# Patient Record
Sex: Male | Born: 1940 | Race: White | Hispanic: No | Marital: Married | Smoking: Former smoker
Health system: Southern US, Community
[De-identification: ages and names within clinical notes are randomized; demographics above are authoritative.]

## PROBLEM LIST (undated history)

## (undated) DIAGNOSIS — C189 Malignant neoplasm of colon, unspecified: Secondary | ICD-10-CM

## (undated) DIAGNOSIS — I10 Essential (primary) hypertension: Secondary | ICD-10-CM

## (undated) DIAGNOSIS — E119 Type 2 diabetes mellitus without complications: Secondary | ICD-10-CM

## (undated) DIAGNOSIS — I2699 Other pulmonary embolism without acute cor pulmonale: Secondary | ICD-10-CM

## (undated) HISTORY — PX: PARATHYROIDECTOMY: SHX19

## (undated) HISTORY — PX: KNEE ARTHROSCOPY: SHX127

## (undated) HISTORY — PX: TOE AMPUTATION: SHX809

## (undated) HISTORY — DX: Essential (primary) hypertension: I10

## (undated) HISTORY — PX: FOOT SURGERY: SHX648

## (undated) HISTORY — PX: HEMORRHOID SURGERY: SHX153

## (undated) HISTORY — PX: TONSILLECTOMY: SUR1361

## (undated) HISTORY — DX: Other pulmonary embolism without acute cor pulmonale: I26.99

## (undated) HISTORY — DX: Type 2 diabetes mellitus without complications: E11.9

## (undated) HISTORY — PX: LITHOTRIPSY: SUR834

## (undated) HISTORY — PX: CHOLECYSTECTOMY: SHX55

## (undated) HISTORY — DX: Malignant neoplasm of colon, unspecified: C18.9

---

## 1999-09-08 ENCOUNTER — Ambulatory Visit (HOSPITAL_COMMUNITY): Admission: RE | Admit: 1999-09-08 | Discharge: 1999-09-08 | Payer: Self-pay | Admitting: Family Medicine

## 1999-09-08 ENCOUNTER — Encounter: Payer: Self-pay | Admitting: Family Medicine

## 2002-02-06 ENCOUNTER — Emergency Department (HOSPITAL_COMMUNITY): Admission: EM | Admit: 2002-02-06 | Discharge: 2002-02-06 | Payer: Self-pay

## 2004-02-15 ENCOUNTER — Other Ambulatory Visit: Payer: Self-pay

## 2004-02-19 ENCOUNTER — Ambulatory Visit: Payer: Self-pay | Admitting: Podiatry

## 2004-04-12 ENCOUNTER — Encounter: Payer: Self-pay | Admitting: Rheumatology

## 2004-05-09 ENCOUNTER — Encounter: Payer: Self-pay | Admitting: Rheumatology

## 2007-04-07 ENCOUNTER — Other Ambulatory Visit: Payer: Self-pay

## 2007-04-07 ENCOUNTER — Emergency Department: Payer: Self-pay | Admitting: Emergency Medicine

## 2007-04-15 ENCOUNTER — Ambulatory Visit: Payer: Self-pay | Admitting: Internal Medicine

## 2007-04-18 ENCOUNTER — Ambulatory Visit: Payer: Self-pay | Admitting: Internal Medicine

## 2007-04-23 ENCOUNTER — Ambulatory Visit: Payer: Self-pay | Admitting: Internal Medicine

## 2007-04-30 ENCOUNTER — Ambulatory Visit: Payer: Self-pay | Admitting: Internal Medicine

## 2011-07-27 DIAGNOSIS — I10 Essential (primary) hypertension: Secondary | ICD-10-CM | POA: Diagnosis present

## 2011-09-07 DIAGNOSIS — G909 Disorder of the autonomic nervous system, unspecified: Secondary | ICD-10-CM | POA: Diagnosis present

## 2013-12-15 ENCOUNTER — Ambulatory Visit: Payer: Self-pay | Admitting: Internal Medicine

## 2016-03-30 ENCOUNTER — Other Ambulatory Visit: Payer: Self-pay | Admitting: Orthopedic Surgery

## 2016-03-30 DIAGNOSIS — M25561 Pain in right knee: Secondary | ICD-10-CM

## 2016-04-05 ENCOUNTER — Encounter
Admission: RE | Admit: 2016-04-05 | Discharge: 2016-04-05 | Disposition: A | Discharge status: Home / Self Care | Payer: Medicare Other | Source: Ambulatory Visit | Attending: Orthopedic Surgery | Admitting: Orthopedic Surgery

## 2016-04-05 DIAGNOSIS — M25561 Pain in right knee: Secondary | ICD-10-CM | POA: Insufficient documentation

## 2016-04-05 MED ORDER — TECHNETIUM TC 99M MEDRONATE IV KIT
24.2000 | PACK | Freq: Once | INTRAVENOUS | Status: AC | PRN
  Administered 2016-04-05: 24.2 via INTRAVENOUS

## 2016-04-17 ENCOUNTER — Ambulatory Visit
Admission: RE | Admit: 2016-04-17 | Discharge: 2016-04-17 | Disposition: A | Discharge status: Home / Self Care | Payer: Medicare Other | Source: Ambulatory Visit | Attending: General Surgery | Admitting: General Surgery

## 2016-04-18 ENCOUNTER — Other Ambulatory Visit: Payer: Self-pay | Admitting: General Surgery

## 2016-04-18 DIAGNOSIS — E213 Hyperparathyroidism, unspecified: Secondary | ICD-10-CM

## 2016-04-18 DIAGNOSIS — R82994 Hypercalciuria: Secondary | ICD-10-CM

## 2016-04-18 DIAGNOSIS — N200 Calculus of kidney: Secondary | ICD-10-CM

## 2016-04-27 ENCOUNTER — Ambulatory Visit
Admission: RE | Admit: 2016-04-27 | Discharge: 2016-04-27 | Disposition: A | Discharge status: Home / Self Care | Payer: Medicare Other | Source: Ambulatory Visit | Attending: General Surgery | Admitting: General Surgery

## 2016-04-27 DIAGNOSIS — M85831 Other specified disorders of bone density and structure, right forearm: Secondary | ICD-10-CM | POA: Insufficient documentation

## 2016-04-27 DIAGNOSIS — E213 Hyperparathyroidism, unspecified: Secondary | ICD-10-CM

## 2016-04-27 DIAGNOSIS — N200 Calculus of kidney: Secondary | ICD-10-CM | POA: Diagnosis present

## 2016-04-27 DIAGNOSIS — M85852 Other specified disorders of bone density and structure, left thigh: Secondary | ICD-10-CM | POA: Insufficient documentation

## 2016-04-27 DIAGNOSIS — R82994 Hypercalciuria: Secondary | ICD-10-CM

## 2016-11-01 ENCOUNTER — Other Ambulatory Visit: Payer: Self-pay | Admitting: Internal Medicine

## 2016-11-01 DIAGNOSIS — M6281 Muscle weakness (generalized): Secondary | ICD-10-CM

## 2016-11-01 DIAGNOSIS — R531 Weakness: Secondary | ICD-10-CM

## 2016-11-07 ENCOUNTER — Ambulatory Visit
Admission: RE | Admit: 2016-11-07 | Discharge: 2016-11-07 | Disposition: A | Discharge status: Home / Self Care | Payer: Medicare Other | Source: Ambulatory Visit | Attending: Internal Medicine | Admitting: Internal Medicine

## 2016-11-07 DIAGNOSIS — R531 Weakness: Secondary | ICD-10-CM | POA: Insufficient documentation

## 2017-06-01 IMAGING — CT NM BONE 3 PHASE
1 series · 12 of 14 positions shown, 15 images · non-contrast
Comparison: None.

CLINICAL DATA: Right knee pain. History of total right knee
arthroplasty 6166.

EXAM:
NUCLEAR MEDICINE 3-PHASE BONE SCAN
TECHNIQUE: Radionuclide angiographic images, immediate static blood pool
images, and 3-hour delayed static images were obtained of the lower
extremities after intravenous injection of radiopharmaceutical.
RADIOPHARMACEUTICALS:  24.2 mCi Sc-22m MDP

[Series 4: 3d bone 1.25 b70s · axial · 0.98mm/px · z∈[+630,+961]mm · 12 of 561 slices shown, 15 images]
[im 44/561  soft-tissue]
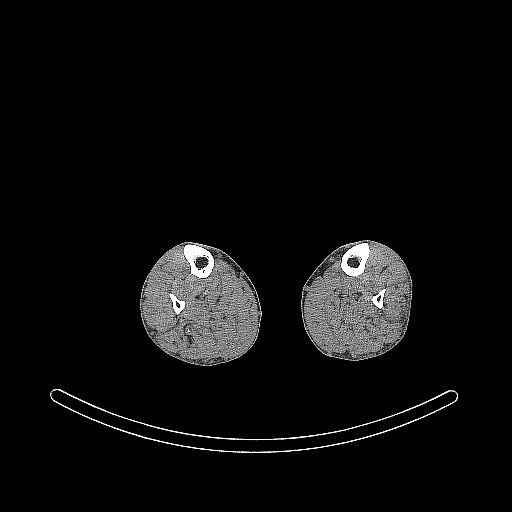
[im 44/561  bone]
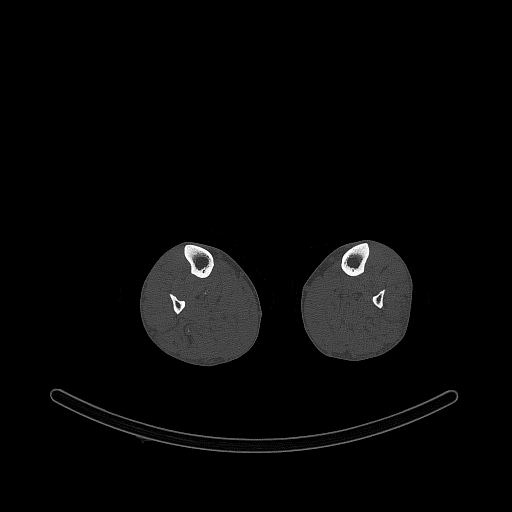
[im 87/561  bone]
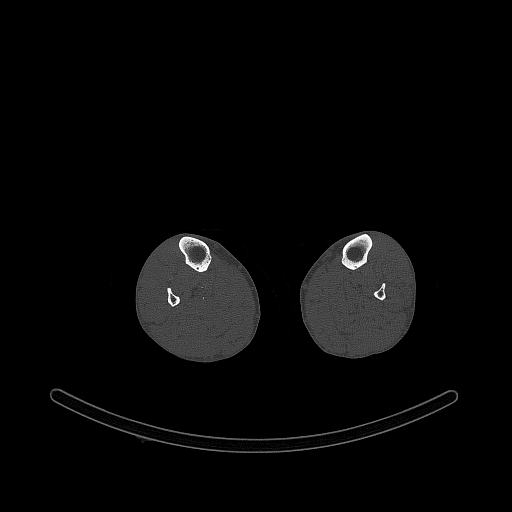
[im 130/561  bone]
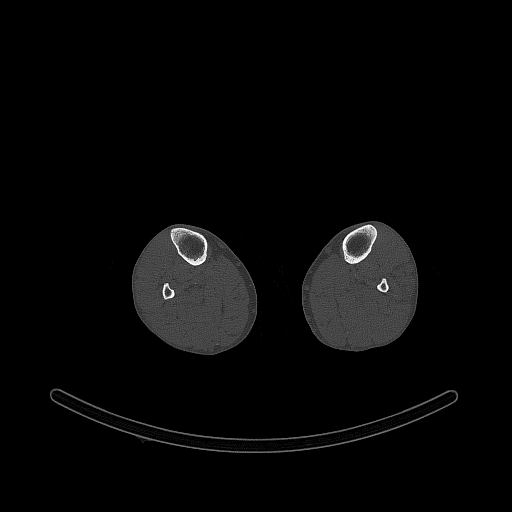
[im 173/561  bone]
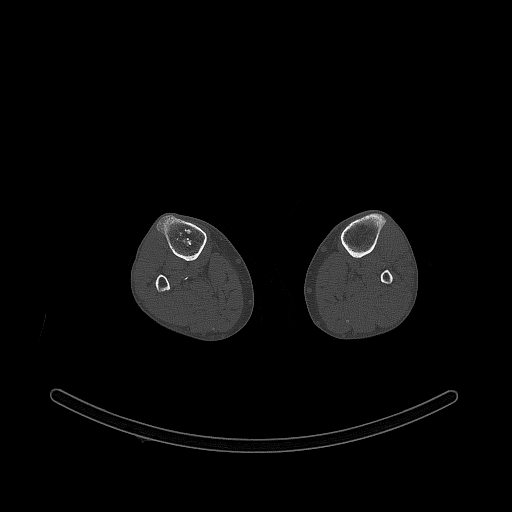
[im 216/561  soft-tissue]
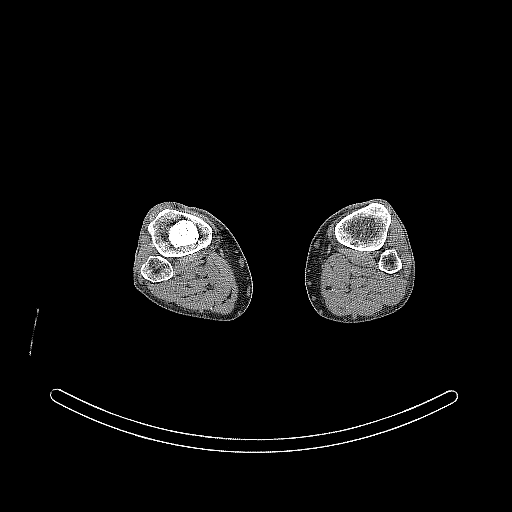
[im 216/561  bone]
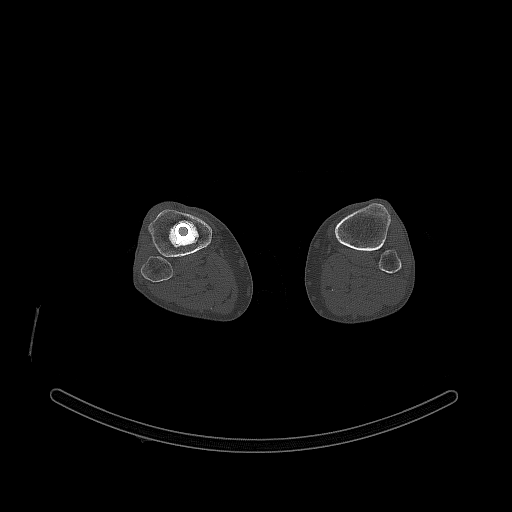
[im 259/561  bone]
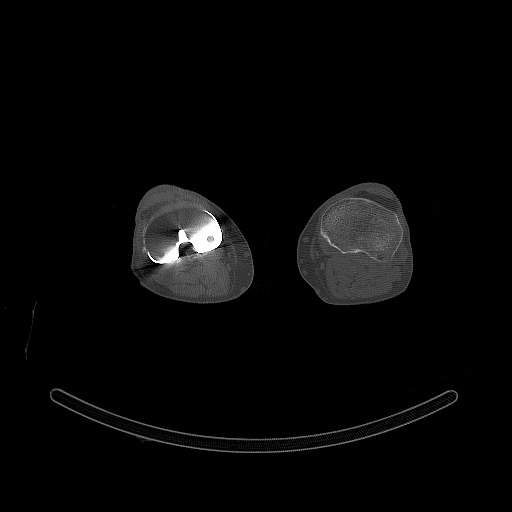
[im 302/561  bone]
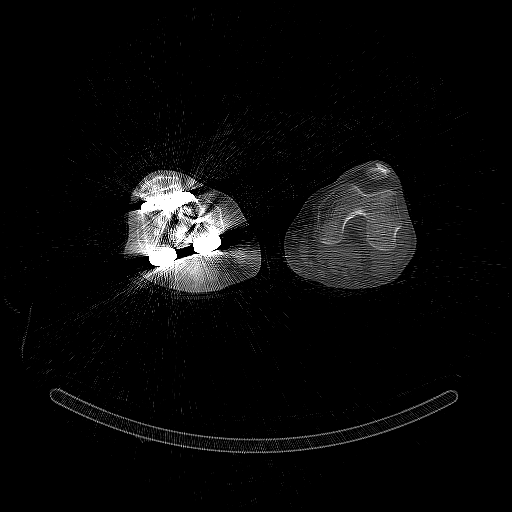
[im 345/561  bone]
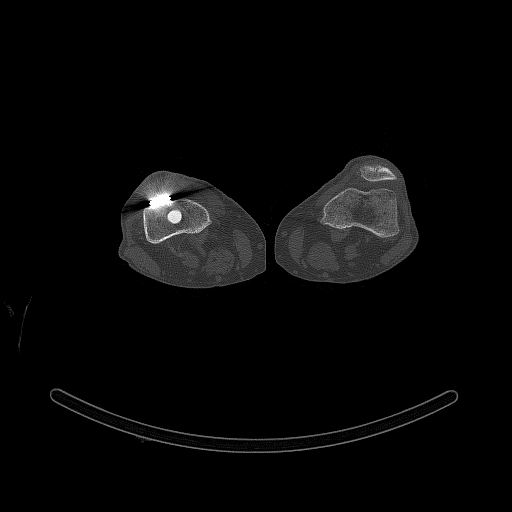
[im 388/561  soft-tissue]
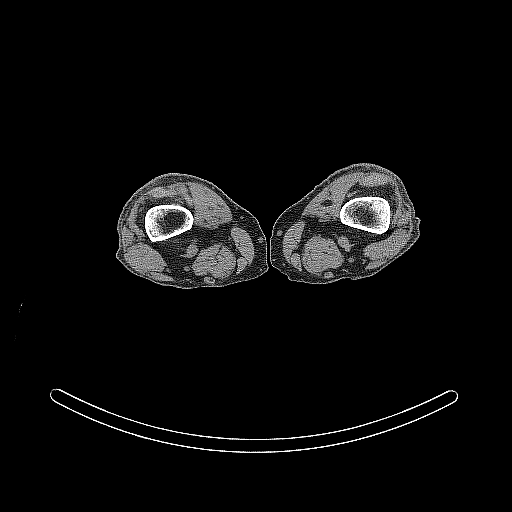
[im 388/561  bone]
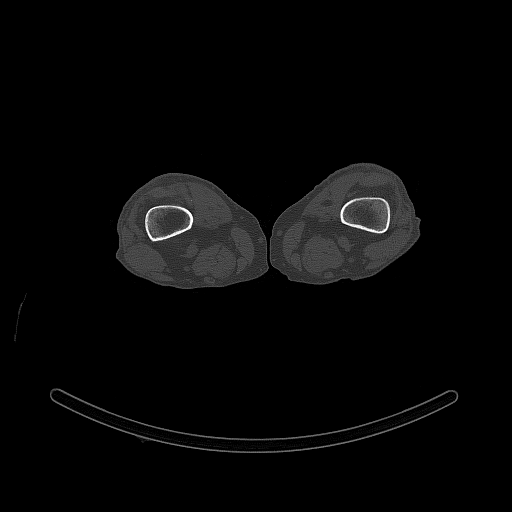
[im 431/561  bone]
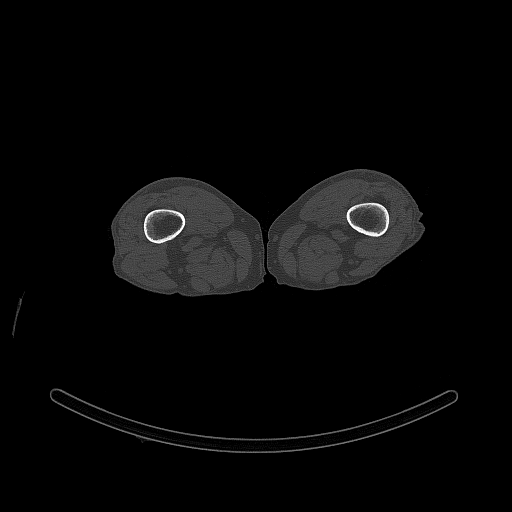
[im 474/561  bone]
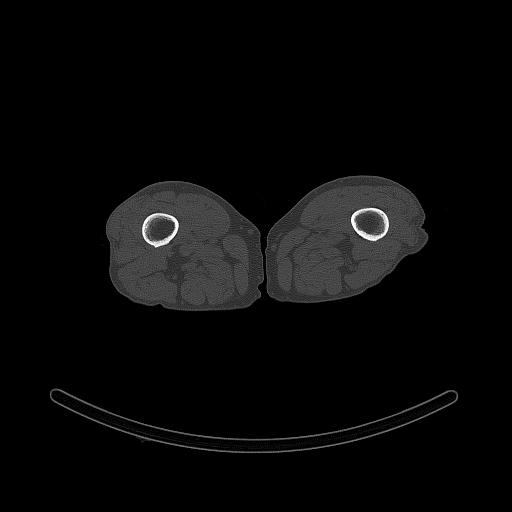
[im 517/561  bone]
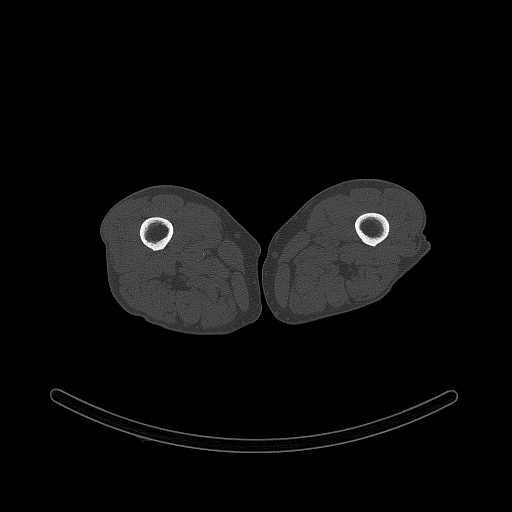

[12 of 14 positions shown; findings below may reference images not displayed]

FINDINGS: Vascular phase: Normal

Blood pool phase: Small focus of asymmetric uptake noted in the
region of the medial femoral condyle.

Delayed phase: Persistent focus of asymmetric uptake in the medial
femoral condyle. There is also fairly symmetric uptake in the tibia
which is probably chronic remodeling.
IMPRESSION: Small focus of asymmetric uptake noted in the medial femoral condyle
of the right knee without obvious CT correlate. This could be a
small stress reaction, stress fracture or focus of loosening.

## 2017-11-29 DIAGNOSIS — E1159 Type 2 diabetes mellitus with other circulatory complications: Secondary | ICD-10-CM | POA: Diagnosis present

## 2020-01-28 DIAGNOSIS — D682 Hereditary deficiency of other clotting factors: Secondary | ICD-10-CM | POA: Diagnosis present

## 2020-01-28 DIAGNOSIS — Z85038 Personal history of other malignant neoplasm of large intestine: Secondary | ICD-10-CM | POA: Insufficient documentation

## 2021-08-25 ENCOUNTER — Emergency Department: Payer: Medicare Other

## 2021-08-25 ENCOUNTER — Inpatient Hospital Stay
Admission: EM | Admit: 2021-08-25 | Discharge: 2021-08-28 | DRG: 871 | Disposition: A | Discharge status: Home / Self Care | Payer: Medicare Other | Attending: Internal Medicine | Admitting: Internal Medicine

## 2021-08-25 DIAGNOSIS — E1159 Type 2 diabetes mellitus with other circulatory complications: Secondary | ICD-10-CM | POA: Diagnosis present

## 2021-08-25 DIAGNOSIS — Z7901 Long term (current) use of anticoagulants: Secondary | ICD-10-CM | POA: Diagnosis not present

## 2021-08-25 DIAGNOSIS — E1142 Type 2 diabetes mellitus with diabetic polyneuropathy: Secondary | ICD-10-CM | POA: Diagnosis present

## 2021-08-25 DIAGNOSIS — E11610 Type 2 diabetes mellitus with diabetic neuropathic arthropathy: Secondary | ICD-10-CM | POA: Diagnosis present

## 2021-08-25 DIAGNOSIS — U071 COVID-19: Secondary | ICD-10-CM | POA: Diagnosis present

## 2021-08-25 DIAGNOSIS — R55 Syncope and collapse: Secondary | ICD-10-CM | POA: Diagnosis present

## 2021-08-25 DIAGNOSIS — F05 Delirium due to known physiological condition: Secondary | ICD-10-CM | POA: Diagnosis not present

## 2021-08-25 DIAGNOSIS — I110 Hypertensive heart disease with heart failure: Secondary | ICD-10-CM | POA: Diagnosis present

## 2021-08-25 DIAGNOSIS — I4892 Unspecified atrial flutter: Secondary | ICD-10-CM | POA: Diagnosis present

## 2021-08-25 DIAGNOSIS — Z794 Long term (current) use of insulin: Secondary | ICD-10-CM | POA: Diagnosis not present

## 2021-08-25 DIAGNOSIS — Z85038 Personal history of other malignant neoplasm of large intestine: Secondary | ICD-10-CM

## 2021-08-25 DIAGNOSIS — L97519 Non-pressure chronic ulcer of other part of right foot with unspecified severity: Secondary | ICD-10-CM | POA: Diagnosis present

## 2021-08-25 DIAGNOSIS — Z86718 Personal history of other venous thrombosis and embolism: Secondary | ICD-10-CM

## 2021-08-25 DIAGNOSIS — F0390 Unspecified dementia without behavioral disturbance: Secondary | ICD-10-CM | POA: Diagnosis present

## 2021-08-25 DIAGNOSIS — D6851 Activated protein C resistance: Secondary | ICD-10-CM | POA: Diagnosis present

## 2021-08-25 DIAGNOSIS — A419 Sepsis, unspecified organism: Secondary | ICD-10-CM | POA: Diagnosis present

## 2021-08-25 DIAGNOSIS — Z6828 Body mass index (BMI) 28.0-28.9, adult: Secondary | ICD-10-CM | POA: Diagnosis not present

## 2021-08-25 DIAGNOSIS — Z89411 Acquired absence of right great toe: Secondary | ICD-10-CM

## 2021-08-25 DIAGNOSIS — E11621 Type 2 diabetes mellitus with foot ulcer: Secondary | ICD-10-CM | POA: Diagnosis present

## 2021-08-25 DIAGNOSIS — D682 Hereditary deficiency of other clotting factors: Secondary | ICD-10-CM | POA: Diagnosis present

## 2021-08-25 DIAGNOSIS — K7200 Acute and subacute hepatic failure without coma: Secondary | ICD-10-CM | POA: Diagnosis present

## 2021-08-25 DIAGNOSIS — I10 Essential (primary) hypertension: Secondary | ICD-10-CM | POA: Diagnosis present

## 2021-08-25 DIAGNOSIS — R7401 Elevation of levels of liver transaminase levels: Secondary | ICD-10-CM | POA: Diagnosis present

## 2021-08-25 DIAGNOSIS — E872 Acidosis, unspecified: Secondary | ICD-10-CM | POA: Diagnosis present

## 2021-08-25 DIAGNOSIS — A4189 Other specified sepsis: Secondary | ICD-10-CM | POA: Diagnosis present

## 2021-08-25 DIAGNOSIS — G909 Disorder of the autonomic nervous system, unspecified: Secondary | ICD-10-CM | POA: Diagnosis present

## 2021-08-25 DIAGNOSIS — I5022 Chronic systolic (congestive) heart failure: Secondary | ICD-10-CM | POA: Diagnosis present

## 2021-08-25 DIAGNOSIS — I4891 Unspecified atrial fibrillation: Secondary | ICD-10-CM | POA: Diagnosis present

## 2021-08-25 DIAGNOSIS — E1169 Type 2 diabetes mellitus with other specified complication: Secondary | ICD-10-CM | POA: Diagnosis present

## 2021-08-25 DIAGNOSIS — J9601 Acute respiratory failure with hypoxia: Secondary | ICD-10-CM | POA: Diagnosis present

## 2021-08-25 DIAGNOSIS — R6520 Severe sepsis without septic shock: Secondary | ICD-10-CM | POA: Diagnosis present

## 2021-08-25 DIAGNOSIS — E663 Overweight: Secondary | ICD-10-CM | POA: Diagnosis present

## 2021-08-25 DIAGNOSIS — R531 Weakness: Secondary | ICD-10-CM

## 2021-08-25 DIAGNOSIS — F03918 Unspecified dementia, unspecified severity, with other behavioral disturbance: Secondary | ICD-10-CM | POA: Diagnosis not present

## 2021-08-25 DIAGNOSIS — Z86711 Personal history of pulmonary embolism: Secondary | ICD-10-CM

## 2021-08-25 LAB — URINALYSIS, COMPLETE (UACMP) WITH MICROSCOPIC
Bilirubin Urine: NEGATIVE
Glucose, UA: NEGATIVE mg/dL
Ketones, ur: 5 mg/dL — AB
Leukocytes,Ua: NEGATIVE
Nitrite: NEGATIVE
Protein, ur: 100 mg/dL — AB
RBC / HPF: >50 RBC/hpf — ABNORMAL HIGH (ref 0–5)
Specific Gravity, Urine: 1.014 (ref 1.005–1.030)
Squamous Epithelial / HPF: NONE SEEN (ref 0–5)
WBC, UA: NONE SEEN WBC/hpf (ref 0–5)
pH: 7 (ref 5.0–8.0)

## 2021-08-25 LAB — CBC WITH DIFFERENTIAL/PLATELET
Abs Immature Granulocytes: 0.03 10*3/uL (ref 0.00–0.07)
Basophils Absolute: 0 10*3/uL (ref 0.0–0.1)
Basophils Relative: 1 %
Eosinophils Absolute: 0 10*3/uL (ref 0.0–0.5)
Eosinophils Relative: 1 %
HCT: 38.5 % — ABNORMAL LOW (ref 39.0–52.0)
Hemoglobin: 12.7 g/dL — ABNORMAL LOW (ref 13.0–17.0)
Immature Granulocytes: 1 %
Lymphocytes Relative: 11 %
Lymphs Abs: 0.7 10*3/uL (ref 0.7–4.0)
MCH: 29.8 pg (ref 26.0–34.0)
MCHC: 33 g/dL (ref 30.0–36.0)
MCV: 90.4 fL (ref 80.0–100.0)
Monocytes Absolute: 1.2 10*3/uL — ABNORMAL HIGH (ref 0.1–1.0)
Monocytes Relative: 19 %
Neutro Abs: 4.4 10*3/uL (ref 1.7–7.7)
Neutrophils Relative %: 67 %
Platelets: 161 10*3/uL (ref 150–400)
RBC: 4.26 MIL/uL (ref 4.22–5.81)
RDW: 14.2 % (ref 11.5–15.5)
WBC: 6.4 10*3/uL (ref 4.0–10.5)
nRBC: 0 % (ref 0.0–0.2)

## 2021-08-25 LAB — COMPREHENSIVE METABOLIC PANEL
ALT: 66 U/L — ABNORMAL HIGH (ref 0–44)
AST: 84 U/L — ABNORMAL HIGH (ref 15–41)
Albumin: 3.9 g/dL (ref 3.5–5.0)
Alkaline Phosphatase: 71 U/L (ref 38–126)
Anion gap: 12 (ref 5–15)
BUN: 15 mg/dL (ref 8–23)
CO2: 23 mmol/L (ref 22–32)
Calcium: 8.7 mg/dL — ABNORMAL LOW (ref 8.9–10.3)
Chloride: 98 mmol/L (ref 98–111)
Creatinine, Ser: 0.75 mg/dL (ref 0.61–1.24)
GFR, Estimated: >60 mL/min (ref >60)
Glucose, Bld: 181 mg/dL — ABNORMAL HIGH (ref 70–99)
Potassium: 3.6 mmol/L (ref 3.5–5.1)
Sodium: 133 mmol/L — ABNORMAL LOW (ref 135–145)
Total Bilirubin: 1.1 mg/dL (ref 0.3–1.2)
Total Protein: 7.2 g/dL (ref 6.5–8.1)

## 2021-08-25 LAB — RESP PANEL BY RT-PCR (FLU A&B, COVID) ARPGX2
Influenza A by PCR: NEGATIVE
Influenza B by PCR: NEGATIVE
SARS Coronavirus 2 by RT PCR: POSITIVE — AB

## 2021-08-25 LAB — BRAIN NATRIURETIC PEPTIDE: B Natriuretic Peptide: 126.5 pg/mL — ABNORMAL HIGH (ref 0.0–100.0)

## 2021-08-25 LAB — TROPONIN I (HIGH SENSITIVITY): Troponin I (High Sensitivity): 13 ng/L (ref <18)

## 2021-08-25 LAB — LACTIC ACID, PLASMA: Lactic Acid, Venous: 1.8 mmol/L (ref 0.5–1.9)

## 2021-08-25 MED ORDER — LACTATED RINGERS IV BOLUS (SEPSIS)
1000.0000 mL | Freq: Once | INTRAVENOUS | Status: AC
  Administered 2021-08-25: 1000 mL via INTRAVENOUS

## 2021-08-25 MED ORDER — LACTATED RINGERS IV BOLUS (SEPSIS)
1000.0000 mL | Freq: Once | INTRAVENOUS | Status: DC
  Administered 2021-08-25: 1000 mL via INTRAVENOUS

## 2021-08-25 MED ORDER — IOHEXOL 350 MG/ML SOLN
75.0000 mL | Freq: Once | INTRAVENOUS | Status: AC | PRN
  Administered 2021-08-25: 75 mL via INTRAVENOUS

## 2021-08-25 MED ORDER — LACTATED RINGERS IV BOLUS (SEPSIS): 1000.0000 mL | Freq: Once | INTRAVENOUS | Status: DC

## 2021-08-25 NOTE — H&P (Signed)
History and Physical    Patient: Daniel Owen EHM:[id number] DOB: 1940/08/09 DOA: 08/25/2021 DOS: the patient was seen and examined on 08/25/2021 PCP: Jaclyn Shaggy, MD  Patient coming from: Home  Chief Complaint:  Chief Complaint  Patient presents with   Loss of Consciousness    Patient had syncopal episode at home on the toilet per family. Family states this has happened before and patient was dehydrated.     HPI: Daniel Owen is a 81 y.o. 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  Patient reported a 3-day history of congestion and sore throat associated with generalized weakness and decreased energy .  He had mild shortness of breath but no cough, fever or chills or no chest pain he has no nausea, vomiting or abdominal pain or diarrhea did have decreased oral intake.  EMS recorded O2 sats in the mid to upper 80s on room air on their arrival and he required O2 for transport ED course and data review: Temp 99.5, pulse 111 and respirations 23 with BP 97/68 and O2 sat 94% on 2 L.  Labs significant for normal WBC with lactic acid 1.8.  Hemoglobin 12.7.  Troponin 13 and BNP 126.  Mildly elevated transaminases to AST 686 and ALT 66.  Sodium 133.  COVID-positive.  Urinalysis not consistent with UTI. EKG, personally viewed and interpreted showing a flutter with a rate of 110. CTA chest negative for PE or acute findings in the chest  Patient was given a 1 L LR  bolus with improvement in BP to 108/67 by admission.  Hospitalist consulted for admission.    Prior to Admission medications   Not on File    Physical Exam: Vitals:   08/25/21 2037 08/25/21 2115 08/25/21 2130 08/25/21 2200  BP:  (!) 90/52 111/71 108/67  Pulse:  (!) 110 (!) 117 (!) 116  Resp:  (!) 23 (!) 27 (!) 21  Temp:    98.4 F (36.9 C)  TempSrc:    Oral  SpO2:  95% 100% 98%  Weight: 95.3 kg      Physical Exam Vitals and nursing note reviewed.  Constitutional:      General: He is not in acute distress.    Interventions: Nasal cannula in place.     Comments: Conversational dyspnea  HENT:     Head: Normocephalic and atraumatic.  Cardiovascular:     Rate and Rhythm: Tachycardia present. Rhythm irregular.     Heart sounds: Normal heart sounds.  Pulmonary:     Effort: Tachypnea present.     Breath sounds: Normal breath sounds.  Abdominal:     Palpations: Abdomen is soft.     Tenderness: There is no abdominal tenderness.  Neurological:     Mental Status: Mental status is at baseline.     Labs on Admission: I have personally reviewed following labs and imaging studies  CBC: Recent Labs  Lab 08/25/21 2040  WBC 6.4  NEUTROABS 4.4  HGB 12.7*  HCT 38.5*  MCV 90.4  PLT 161   Basic Metabolic Panel: Recent Labs  Lab 08/25/21 2040  NA 133*  K 3.6  CL 98  CO2 23  GLUCOSE 181*  BUN 15  CREATININE 0.75  CALCIUM 8.7*   GFR: CrCl cannot be calculated (Unknown ideal weight.). Liver Function Tests: Recent Labs  Lab 08/25/21 2040  AST 84*  ALT 66*  ALKPHOS 71  BILITOT 1.1  PROT 7.2  ALBUMIN 3.9   No results for input(s): "LIPASE", "AMYLASE" in the last 168 hours. No results for input(s): "AMMONIA" in the last 168 hours. Coagulation Profile: No results for input(s): "INR", "PROTIME" in the last 168 hours. Cardiac Enzymes: No results for input(s): "CKTOTAL", "CKMB", "CKMBINDEX", "TROPONINI" in the last 168 hours. BNP (last 3 results) No results for  input(s): "PROBNP" in the last 8760 hours. HbA1C: No results for input(s): "HGBA1C" in the last 72 hours. CBG: No results for input(s): "GLUCAP" in the last 168 hours. Lipid Profile: No results for input(s): "CHOL", "HDL", "LDLCALC", "TRIG", "CHOLHDL", "LDLDIRECT" in the last 72 hours. Thyroid Function Tests: No results for input(s): "TSH", "T4TOTAL", "FREET4", "T3FREE", "THYROIDAB" in the last 72 hours. Anemia Panel: No results for input(s): "VITAMINB12", "FOLATE", "FERRITIN", "TIBC", "IRON", "RETICCTPCT" in the last 72 hours. Urine analysis:    Component Value Date/Time   COLORURINE YELLOW (A) 08/25/2021 2234   APPEARANCEUR HAZY (A) 08/25/2021 2234   LABSPEC 1.014 08/25/2021 2234   PHURINE 7.0 08/25/2021 2234   GLUCOSEU NEGATIVE 08/25/2021 2234   HGBUR LARGE (A) 08/25/2021 2234   BILIRUBINUR NEGATIVE 08/25/2021 2234   KETONESUR 5 (A) 08/25/2021 2234   PROTEINUR 100 (A) 08/25/2021 2234   NITRITE NEGATIVE 08/25/2021 2234   LEUKOCYTESUR NEGATIVE 08/25/2021 2234    Radiological Exams on Admission: CT Angio Chest PE W and/or Wo Contrast  Result Date: 08/25/2021 CLINICAL DATA:  Syncopal episode PE suspected EXAM: CT ANGIOGRAPHY CHEST WITH CONTRAST TECHNIQUE: Multidetector CT imaging of the chest was performed using the standard protocol during bolus administration of intravenous contrast. Multiplanar CT image reconstructions and MIPs were obtained to evaluate the vascular anatomy. RADIATION DOSE REDUCTION: This exam was performed according to the departmental dose-optimization program which includes automated exposure control, adjustment of the mA and/or kV according to patient size and/or use of iterative reconstruction technique. CONTRAST:  54mL OMNIPAQUE IOHEXOL 350 MG/ML SOLN COMPARISON:  Radiographs earlier today and CT chest 04/07/2007 FINDINGS: Cardiovascular: Satisfactory opacification of the pulmonary arteries to the segmental level. No evidence of pulmonary embolism. Normal heart  size. No pericardial effusion. Advanced coronary artery atherosclerotic calcification. Aortic calcification. Mediastinum/Nodes: No enlarged mediastinal, hilar, or axillary lymph nodes. Thyroid gland, trachea, and esophagus demonstrate no significant findings. Lungs/Pleura: Respiratory motion obscures fine detail. Bibasilar atelectasis/scarring. No focal consolidation, pleural effusion or pneumothorax. Upper Abdomen: No acute abnormality. Musculoskeletal: No chest wall abnormality. No acute or significant osseous findings. Review of the MIP images confirms the above findings. IMPRESSION: Negative for acute pulmonary embolism. No acute findings in the chest. Advanced coronary artery atherosclerosis. Aortic Atherosclerosis (ICD10-I70.0). Electronically Signed   By: Minerva Fester M.D.   On: 08/25/2021 22:45   DG Chest Port 1 View  Result Date: 08/25/2021 CLINICAL DATA:  Questionable sepsis - evaluate for abnormality EXAM: PORTABLE CHEST 1 VIEW COMPARISON:  Chest x-ray 12/15/2013 FINDINGS: The heart and mediastinal contours are unchanged. Persistent elevation of the left hemidiaphragm. No focal consolidation. No pulmonary edema. No pleural effusion. No pneumothorax. No acute osseous abnormality.  Old healed left rib fractures. IMPRESSION: No active disease. Electronically Signed  By: Tish Frederickson M.D.   On: 08/25/2021 21:26     Data Reviewed: Relevant notes from primary care and specialist visits, past discharge summaries as available in EHR, including Care Everywhere. Prior diagnostic testing as pertinent to current admission diagnoses Updated medications and problem lists for reconciliation ED course, including vitals, labs, imaging, treatment and response to treatment Triage notes, nursing and pharmacy notes and ED provider's notes Notable results as noted in HPI   Assessment and Plan: * Acute hypoxemic respiratory failure due to COVID-19 (HCC) O2 sat mid to upper 80s with EMS requiring 2 L to  maintain sats in the mid 90s.  Patient short of breath and tachypneic COVID-positive, CTA chest with no acute findings Continue supplemental oxygen and wean as tolerated We will initiate Paxlovid and steroids.  Albuterol every 6, antitussives as needed  Sepsis (HCC) Patient was tachycardic, tachypneic and hypotensive as well as hypoxic on arrival.  WBC and lactic acid normal Secondary to COVID IV sepsis fluids but monitor for worsening of her respiratory status We will get a procalcitonin  Atrial flutter with rapid ventricular response (HCC) EKG showed a flutter, likely new onset with rate in the 1 teens Likely related to sepsis Patient already on Coumadin Continuous cardiac monitoring Consider cardiology consult and echocardiogram    Syncope and collapse Generalized weakness Likely secondary to acute illness.  We will keep on cardiac monitoring overnight IV hydration Neurologic checks We will keep n.p.o. tonight  Transaminitis Mild transaminitis, likely related to acute COVID infection and sepsis Continue to monitor.  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 Inpatient status is judged to be reasonable and necessary in order  to provide the required intensity of service to ensure the patient's safety. The patient's presenting symptoms, physical exam findings, and initial radiographic and laboratory data in the context of their chronic comorbidities is felt to place them at high risk for further clinical deterioration. Furthermore, it is not anticipated that the patient will be medically stable for discharge from the hospital within 2 midnights of admission.   * I certify that at the point of admission it is my clinical judgment that the patient will require inpatient hospital care spanning beyond 2 midnights from the point of admission due to high intensity of service, high risk for further deterioration and high frequency of surveillance required.*  Author: Andris Baumann, MD 08/25/2021 11:42 PM  For on call review [email or web address].

## 2021-08-25 NOTE — Assessment & Plan Note (Addendum)
Patient requiring 2 L nasal cannula.  CT angiogram of chest notes no acute findings.  Given hypoxia, will change Paxlovid over to Remdisivir.  Continue steroids and nebulizers.  Follow CRP levels.

## 2021-08-25 NOTE — Assessment & Plan Note (Addendum)
Chronic anticoagulation on Coumadin Pharmacy consult for INR management, staying therapeutic.  On day of discharge, INR slightly 6 during patient's hospitalization, INR stable around 2 and on day of discharge, had jumped to 3.1.  Pharmacy recommended patient take 5 mg of Coumadin tonight, 8/20 which we will give this pill for patient to take home and then he will start his regular regimen on 8/21.

## 2021-08-25 NOTE — Assessment & Plan Note (Addendum)
Mild transaminitis, likely related to acute COVID infection.  Still slightly elevated by day of discharge with AST of 96 and ALT of 58.  Recommend repeat lab checks when patient sees his PCP in the next 1 month

## 2021-08-25 NOTE — Assessment & Plan Note (Addendum)
Initially held losartan due to hypotension related to SIRS/sepsis.  With pressures improving, will restart.

## 2021-08-25 NOTE — ED Provider Notes (Signed)
[facility] Provider Note    Event Date/Time   First MD Initiated Contact with Patient 08/25/21 2035     (approximate)   History     HPI  Daniel Owen is a 81 y.o. male with history of diabetes, chronic lower extremity wounds, hypertension, and colon cancer who presents with syncope and generalized weakness.  The patient states that he has been feeling very weak over the last 3 days, enough that he is unable to transfer from 1 chair to another.  Today he was on the toilet and passed out.  He has had this happen before when he is dehydrated.  He denies any associated chest pain, difficulty breathing, fever, vomiting or diarrhea, or any new urinary symptoms in the last few days although he has had some urinary urgency and incontinence recently.    Physical Exam   Triage Vital Signs: ED Triage Vitals  Enc Vitals Group     BP 08/25/21 2033 97/68     Pulse Rate 08/25/21 2033 (!) 111     Resp 08/25/21 2033 (!) 23     Temp 08/25/21 2033 99.5 F (37.5 C)     Temp Source 08/25/21 2033 Oral     SpO2 08/25/21 2033 94 %     Weight 08/25/21 2037 210 lb (95.3 kg)     Height --      Head Circumference --      Peak Flow --      Pain Score 08/25/21 2037 0     Pain Loc --      Pain Edu? --      Excl. in GC? --     Most recent vital signs: Vitals:   08/25/21 2130 08/25/21 2200  BP: 111/71 108/67  Pulse: (!) 117 (!) 116  Resp: (!) 27 (!) 21  Temp:  98.4 F (36.9 C)  SpO2: 100% 98%     General: Alert, oriented x3, somewhat weak appearing but in no acute distress. CV:  Good peripheral perfusion.  Normal heart sounds. Resp:  Normal effort.  Lungs CTAB. Abd:  Soft and nontender.  No distention.  Other:  EOMI.  PERRLA.  Motor intact in all extremities.  Dry mucous membranes.  No peripheral edema.  No erythema or induration to the lower extremities.   ED Results / Procedures / Treatments   Labs (all labs ordered are listed, but only abnormal results are  displayed) Labs Reviewed  RESP PANEL BY RT-PCR (FLU A&B, COVID) ARPGX2 - Abnormal; Notable for the following components:      Result Value   SARS Coronavirus 2 by RT PCR POSITIVE (*)    All other components within normal limits  COMPREHENSIVE METABOLIC PANEL - Abnormal; Notable for the following components:   Sodium 133 (*)    Glucose, Bld 181 (*)    Calcium 8.7 (*)    AST 84 (*)    ALT 66 (*)    All other components within normal limits  CBC WITH DIFFERENTIAL/PLATELET - Abnormal; Notable for the following components:   Hemoglobin 12.7 (*)    HCT 38.5 (*)    Monocytes Absolute 1.2 (*)    All other components within normal limits  URINALYSIS, COMPLETE (UACMP) WITH MICROSCOPIC - Abnormal; Notable for the following components:   Color, Urine YELLOW (*)    APPearance HAZY (*)    Hgb urine dipstick LARGE (*)    Ketones, ur 5 (*)    Protein, ur 100 (*)  RBC / HPF >50 (*)    Bacteria, UA RARE (*)    All other components within normal limits  BRAIN NATRIURETIC PEPTIDE - Abnormal; Notable for the following components:   B Natriuretic Peptide 126.5 (*)    All other components within normal limits  CULTURE, BLOOD (ROUTINE X 2)  CULTURE, BLOOD (ROUTINE X 2)  URINE CULTURE  LACTIC ACID, PLASMA  LACTIC ACID, PLASMA  TROPONIN I (HIGH SENSITIVITY)  TROPONIN I (HIGH SENSITIVITY)     EKG  ED ECG REPORT I, Dionne Bucy, the attending physician, personally viewed and interpreted this ECG.  Date: 08/25/2021 EKG Time: 2033 Rate: 110 Rhythm: Possible atrial flutter QRS Axis: normal Intervals: Nonspecific IVCD ST/T Wave abnormalities: Nonspecific ST abnormalities Narrative Interpretation: Nonspecific abnormalities with no evidence of acute ischemia; no recent prior EKG available for comparison    RADIOLOGY  Chest x-ray: I independently viewed and interpreted the images; is no focal consolidation or pneumonia  CT angio chest: No acute PE  PROCEDURES:  Critical Care  performed: Yes, see critical care procedure note(s)  .Critical Care  Performed by: Dionne Bucy, MD Authorized by: Dionne Bucy, MD   Critical care provider statement:    Critical care time (minutes):  30   Critical care was necessary to treat or prevent imminent or life-threatening deterioration of the following conditions:  Sepsis   Critical care was time spent personally by me on the following activities:  Development of treatment plan with patient or surrogate, discussions with consultants, evaluation of patient's response to treatment, examination of patient, ordering and review of laboratory studies, ordering and review of radiographic studies, ordering and performing treatments and interventions, pulse oximetry, re-evaluation of patient's condition and review of old charts   Care discussed with: admitting provider      MEDICATIONS ORDERED IN ED: Medications  lactated ringers bolus 1,000 mL (0 mLs Intravenous Stopped 08/25/21 2145)  iohexol (OMNIPAQUE) 350 MG/ML injection 75 mL (75 mLs Intravenous Contrast Given 08/25/21 2229)     IMPRESSION / MDM / ASSESSMENT AND PLAN / ED COURSE  I reviewed the triage vital signs and the nursing notes.  81 year old male with PMH as noted above with generalized weakness over the last few days and a syncopal episode today.  On exam the patient is alert but is hypotensive and tachycardic with a borderline elevated temperature.  O2 saturation was in the high 80s on room air.  I reviewed the past medical records.  The patient was most recently seen by podiatry on 8/4 for follow-up after right second and third metatarsal resection last month and he was recommended to maintain nonweightbearing but had no evidence of acute infection at that time.  Differential diagnosis includes, but is not limited to, sepsis and possible UTI versus pneumonia, COVID-19, or viral syndrome, less clear cellulitis or wound infection, dehydration, DKA, other  metabolic disturbance.  We will give fluids, obtain lab work-up and chest x-ray, and reassess.  I anticipate the patient will need admission.  Patient's presentation is most consistent with acute presentation with potential threat to life or bodily function.  The patient is on the cardiac monitor to evaluate for evidence of arrhythmia and/or significant heart rate changes.  ----------------------------------------- 11:23 PM on 08/25/2021 -----------------------------------------  Chest x-ray showed no acute findings and the urinalysis is negative.  Lactate is normal.  There is no leukocytosis.  I obtained a CT angio chest to rule out PE and it is negative.  However, the patient is positive for COVID which  I think is the likely source of his symptoms.  Since he has been sick for more than 3 days he is not a candidate for Paxlovid, however he will need admission given the hypoxia and abnormal vital signs.  I consulted Dr. Para March from the hospitalist service; based on her discussion she agrees to admit the patient.   FINAL CLINICAL IMPRESSION(S) / ED DIAGNOSES   Final diagnoses:  Acute respiratory failure with hypoxia (HCC)  COVID-19     Rx / DC Orders   ED Discharge Orders     None        Note:  This document was prepared using Dragon voice recognition software and may include unintentional dictation errors.    Dionne Bucy, MD 08/25/21 2324

## 2021-08-25 NOTE — Assessment & Plan Note (Addendum)
Secondary to hypotension brought on by COVID. \Patient rehydrated.

## 2021-08-25 NOTE — Assessment & Plan Note (Addendum)
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  Appreciate diabetic coordinator help and have started some long-term acting insulin while on steroids. Continue Lyrica:  Fall precautions

## 2021-08-25 NOTE — Assessment & Plan Note (Addendum)
Patient met criteria for sepsis severe sepsis on admission secondary to COVID secondary to COVID source, tachypnea, tachycardia and leukocytosis with lactic acidosis.  Sepsis stabilized patient aggressively fluid resuscitated.

## 2021-08-25 NOTE — Assessment & Plan Note (Signed)
Will hold Lyrica unitl more consistently alert

## 2021-08-25 NOTE — Assessment & Plan Note (Signed)
No acute issues suspected 

## 2021-08-26 ENCOUNTER — Inpatient Hospital Stay (HOSPITAL_COMMUNITY)
Admit: 2021-08-26 | Discharge: 2021-08-26 | Disposition: A | Discharge status: Home / Self Care | Payer: Medicare Other | Attending: Internal Medicine | Admitting: Internal Medicine

## 2021-08-26 DIAGNOSIS — I4891 Unspecified atrial fibrillation: Secondary | ICD-10-CM | POA: Diagnosis not present

## 2021-08-26 DIAGNOSIS — I5022 Chronic systolic (congestive) heart failure: Secondary | ICD-10-CM

## 2021-08-26 DIAGNOSIS — U071 COVID-19: Secondary | ICD-10-CM | POA: Diagnosis not present

## 2021-08-26 DIAGNOSIS — I4892 Unspecified atrial flutter: Secondary | ICD-10-CM | POA: Diagnosis not present

## 2021-08-26 DIAGNOSIS — E663 Overweight: Secondary | ICD-10-CM | POA: Diagnosis present

## 2021-08-26 DIAGNOSIS — J9601 Acute respiratory failure with hypoxia: Secondary | ICD-10-CM | POA: Diagnosis not present

## 2021-08-26 LAB — ECHOCARDIOGRAM COMPLETE
AR max vel: 2.49 cm2
AV Area VTI: 2.51 cm2
AV Area mean vel: 2.45 cm2
AV Mean grad: 3 mmHg
AV Peak grad: 4.8 mmHg
Ao pk vel: 1.1 m/s
Area-P 1/2: 6.12 cm2
Calc EF: 45.3 %
Height: 72.008 in
S' Lateral: 2.39 cm
Single Plane A2C EF: 43.7 %
Single Plane A4C EF: 47.6 %
Weight: 3360 oz

## 2021-08-26 LAB — PROCALCITONIN: Procalcitonin: <0.1 ng/mL

## 2021-08-26 LAB — PROTIME-INR
INR: 2.1 — ABNORMAL HIGH (ref 0.8–1.2)
Prothrombin Time: 23.4 seconds — ABNORMAL HIGH (ref 11.4–15.2)

## 2021-08-26 LAB — CBG MONITORING, ED
Glucose-Capillary: 193 mg/dL — ABNORMAL HIGH (ref 70–99)
Glucose-Capillary: 206 mg/dL — ABNORMAL HIGH (ref 70–99)
Glucose-Capillary: 207 mg/dL — ABNORMAL HIGH (ref 70–99)
Glucose-Capillary: 248 mg/dL — ABNORMAL HIGH (ref 70–99)
Glucose-Capillary: 257 mg/dL — ABNORMAL HIGH (ref 70–99)

## 2021-08-26 LAB — TROPONIN I (HIGH SENSITIVITY): Troponin I (High Sensitivity): 15 ng/L (ref <18)

## 2021-08-26 LAB — LACTIC ACID, PLASMA
Lactic Acid, Venous: 2 mmol/L (ref 0.5–1.9)
Lactic Acid, Venous: 1.5 mmol/L (ref 0.5–1.9)

## 2021-08-26 LAB — HEMOGLOBIN A1C
Hgb A1c MFr Bld: 7.2 % — ABNORMAL HIGH (ref 4.8–5.6)
Mean Plasma Glucose: 159.94 mg/dL

## 2021-08-26 LAB — APTT: aPTT: 68 seconds — ABNORMAL HIGH (ref 24–36)

## 2021-08-26 LAB — GLUCOSE, CAPILLARY: Glucose-Capillary: 262 mg/dL — ABNORMAL HIGH (ref 70–99)

## 2021-08-26 MED ORDER — TAMSULOSIN HCL 0.4 MG PO CAPS
0.4000 mg | ORAL_CAPSULE | Freq: Every day | ORAL | Status: DC
  Administered 2021-08-27 – 2021-08-28 (×2): 0.4 mg via ORAL
  Filled 2021-08-26 (×2): qty 1

## 2021-08-26 MED ORDER — ALLOPURINOL 100 MG PO TABS
300.0000 mg | ORAL_TABLET | Freq: Every day | ORAL | Status: DC
  Administered 2021-08-27 – 2021-08-28 (×2): 300 mg via ORAL
  Filled 2021-08-26 (×2): qty 3
  Filled 2021-08-26: qty 1

## 2021-08-26 MED ORDER — PREDNISONE 10 MG PO TABS
50.0000 mg | ORAL_TABLET | Freq: Every day | ORAL | Status: DC
Start: 2021-08-29 — End: 2021-08-26

## 2021-08-26 MED ORDER — SODIUM CHLORIDE 0.9 % IV SOLN
200.0000 mg | Freq: Once | INTRAVENOUS | Status: AC
  Administered 2021-08-26: 200 mg via INTRAVENOUS
  Filled 2021-08-26: qty 40

## 2021-08-26 MED ORDER — SODIUM CHLORIDE 0.9 % IV SOLN: INTRAVENOUS | Status: DC

## 2021-08-26 MED ORDER — METHYLPREDNISOLONE SODIUM SUCC 125 MG IJ SOLR
47.5000 mg | Freq: Once | INTRAMUSCULAR | Status: AC
Start: 2021-08-26 — End: 2021-08-26
  Administered 2021-08-26: 47.5 mg via INTRAVENOUS
  Filled 2021-08-26: qty 2

## 2021-08-26 MED ORDER — INSULIN ASPART 100 UNIT/ML IJ SOLN
0.0000 [IU] | INTRAMUSCULAR | Status: DC
  Administered 2021-08-26: 8 [IU] via SUBCUTANEOUS
  Administered 2021-08-26: 5 [IU] via SUBCUTANEOUS
  Administered 2021-08-26: 3 [IU] via SUBCUTANEOUS
  Administered 2021-08-26: 5 [IU] via SUBCUTANEOUS
  Administered 2021-08-26: 8 [IU] via SUBCUTANEOUS
  Administered 2021-08-27: 11 [IU] via SUBCUTANEOUS
  Administered 2021-08-27 (×2): 5 [IU] via SUBCUTANEOUS
  Administered 2021-08-27: 3 [IU] via SUBCUTANEOUS
  Administered 2021-08-27: 11 [IU] via SUBCUTANEOUS
  Administered 2021-08-27: 3 [IU] via SUBCUTANEOUS
  Administered 2021-08-27: 5 [IU] via SUBCUTANEOUS
  Administered 2021-08-28: 3 [IU] via SUBCUTANEOUS
  Administered 2021-08-28 (×2): 5 [IU] via SUBCUTANEOUS
  Filled 2021-08-26 (×15): qty 1

## 2021-08-26 MED ORDER — NIRMATRELVIR/RITONAVIR (PAXLOVID)TABLET: 3.0000 | ORAL_TABLET | Freq: Two times a day (BID) | ORAL | Status: DC

## 2021-08-26 MED ORDER — ACETAMINOPHEN 325 MG PO TABS: 650.0000 mg | ORAL_TABLET | Freq: Four times a day (QID) | ORAL | Status: DC | PRN

## 2021-08-26 MED ORDER — METHYLPREDNISOLONE SODIUM SUCC 125 MG IJ SOLR
0.5000 mg/kg | Freq: Two times a day (BID) | INTRAMUSCULAR | Status: DC
  Administered 2021-08-26: 47.5 mg via INTRAVENOUS
  Filled 2021-08-26: qty 2

## 2021-08-26 MED ORDER — INSULIN DETEMIR 100 UNIT/ML ~~LOC~~ SOLN
10.0000 [IU] | Freq: Every day | SUBCUTANEOUS | Status: DC
  Administered 2021-08-26 – 2021-08-28 (×3): 10 [IU] via SUBCUTANEOUS
  Filled 2021-08-26 (×3): qty 0.1

## 2021-08-26 MED ORDER — ATORVASTATIN CALCIUM 20 MG PO TABS
40.0000 mg | ORAL_TABLET | Freq: Every day | ORAL | Status: DC
  Administered 2021-08-26 – 2021-08-28 (×3): 40 mg via ORAL
  Filled 2021-08-26 (×3): qty 2

## 2021-08-26 MED ORDER — ONDANSETRON HCL 4 MG PO TABS: 4.0000 mg | ORAL_TABLET | Freq: Four times a day (QID) | ORAL | Status: DC | PRN

## 2021-08-26 MED ORDER — METOPROLOL TARTRATE 25 MG PO TABS
12.5000 mg | ORAL_TABLET | Freq: Two times a day (BID) | ORAL | Status: DC
  Administered 2021-08-26 – 2021-08-28 (×5): 12.5 mg via ORAL
  Filled 2021-08-26 (×5): qty 1

## 2021-08-26 MED ORDER — METHYLPREDNISOLONE SODIUM SUCC 125 MG IJ SOLR
95.0000 mg | INTRAMUSCULAR | Status: DC
  Administered 2021-08-27: 95 mg via INTRAVENOUS
  Filled 2021-08-26: qty 2

## 2021-08-26 MED ORDER — LACTATED RINGERS IV BOLUS
500.0000 mL | Freq: Once | INTRAVENOUS | Status: AC
  Administered 2021-08-26: 500 mL via INTRAVENOUS

## 2021-08-26 MED ORDER — ALBUTEROL SULFATE HFA 108 (90 BASE) MCG/ACT IN AERS
2.0000 | INHALATION_SPRAY | Freq: Four times a day (QID) | RESPIRATORY_TRACT | Status: DC
  Administered 2021-08-26 – 2021-08-28 (×8): 2 via RESPIRATORY_TRACT
  Filled 2021-08-26 (×3): qty 6.7

## 2021-08-26 MED ORDER — WARFARIN - PHARMACIST DOSING INPATIENT
Freq: Every day | Status: DC
  Filled 2021-08-26: qty 1

## 2021-08-26 MED ORDER — PREDNISONE 50 MG PO TABS: 50.0000 mg | ORAL_TABLET | Freq: Every day | ORAL | Status: DC

## 2021-08-26 MED ORDER — GUAIFENESIN-DM 100-10 MG/5ML PO SYRP
10.0000 mL | ORAL_SOLUTION | ORAL | Status: DC | PRN
  Administered 2021-08-26: 10 mL via ORAL
  Filled 2021-08-26: qty 10

## 2021-08-26 MED ORDER — ONDANSETRON HCL 4 MG/2ML IJ SOLN: 4.0000 mg | Freq: Four times a day (QID) | INTRAMUSCULAR | Status: DC | PRN

## 2021-08-26 MED ORDER — HYDROCOD POLI-CHLORPHE POLI ER 10-8 MG/5ML PO SUER: 5.0000 mL | Freq: Two times a day (BID) | ORAL | Status: DC | PRN

## 2021-08-26 MED ORDER — SODIUM CHLORIDE 0.9 % IV SOLN
100.0000 mg | Freq: Every day | INTRAVENOUS | Status: AC
  Administered 2021-08-27 – 2021-08-28 (×2): 100 mg via INTRAVENOUS
  Filled 2021-08-26: qty 100
  Filled 2021-08-26: qty 20

## 2021-08-26 MED ORDER — WARFARIN SODIUM 7.5 MG PO TABS
7.5000 mg | ORAL_TABLET | Freq: Every day | ORAL | Status: DC
Start: 2021-08-26 — End: 2021-08-27
  Administered 2021-08-26: 7.5 mg via ORAL
  Filled 2021-08-26: qty 1

## 2021-08-26 NOTE — Progress Notes (Signed)
ANTICOAGULATION CONSULT NOTE - Initial Consult  Pharmacy Consult for Warfarin  Indication: atrial fibrillation,  Factor V (Leiden) deficiency, Hx of PE/DVT   No Known Allergies  Patient Measurements: Weight: 95.3 kg (210 lb) Heparin Dosing Weight:   Vital Signs: Temp: 98.5 F (36.9 C) (08/17 2348) Temp Source: Oral (08/17 2348) BP: 98/66 (08/18 0100) Pulse Rate: 107 (08/18 0100)  Labs: Recent Labs    08/25/21 2040 08/26/21 0053  HGB 12.7*  --   HCT 38.5*  --   PLT 161  --   APTT  --  68*  LABPROT  --  23.4*  INR  --  2.1*  CREATININE 0.75  --   TROPONINIHS 13  --     CrCl cannot be calculated (Unknown ideal weight.).   Medical History: No past medical history on file.  Medications:  (Not in a hospital admission)   Assessment: Pharmacy consulted to dose warfarin for Afib in this 81 year old male admitted with respiratory failure due to COVID.  Pt was on Warfarin 7.5 mg PO daily, last dose 8/17 AM.   8/18: INR @ 0053 = 2.1, therapeutic   Goal of Therapy:  INR 2-3   Plan:  8/18: INR @ 0053 = 2.1, therapeutic Will continue pt on home dose of Warfarin 7.5 mg PO daily to resume 8/18 @ 1600. Will check INR daily.   Elize Pinon D 08/26/2021,1:34 AM

## 2021-08-26 NOTE — Progress Notes (Signed)
Remdesivir - Pharmacy Brief Note   O:  ALT: 66 CXR: neg SpO2: 92% on 2 L/min   A/P:  Remdesivir 200 mg IVPB once followed by 100 mg IVPB daily x 4 days.   Bari Mantis PharmD Clinical Pharmacist 08/26/2021

## 2021-08-26 NOTE — Progress Notes (Signed)
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  Patient has had a previous 3-day history of congestion and sore throat.  The emergency room, noted to be mildly hypotensive as well as hypoxic with oxygen saturations in the mid 80s requiring 2 L nasal cannula.  Work-up noteworthy for transaminitis, new onset atrial fibrillation with rapid ventricular rate and patient was COVID-positive.  Echocardiogram ordered noting decreased slightly decreased ejection fraction of 45 to 50%  Assessment and Plan: Assessment and Plan: * Acute hypoxemic respiratory failure due to COVID-19 (HCC) Patient requiring 2 L nasal cannula.  CT angiogram of chest notes no acute findings.  Given hypoxia, will change Paxlovid over to Remdisivir.  Continue steroids and nebulizers.  Follow CRP levels.   Sepsis (HCC) Patient met criteria for sepsis severe sepsis on admission secondary to COVID secondary to COVID source, tachypnea, tachycardia and leukocytosis with lactic acidosis.  Sepsis stabilized patient aggressively fluid resuscitated.  Atrial flutter with rapid ventricular response (HCC) Already on Coumadin for factor V Leiden deficiency history.  Still with some tachycardia so we will add beta-blocker.  Heart rate still slightly elevated, likely in the setting of COVID and infection.    Chronic systolic CHF (congestive heart failure) (HCC) New finding.  Echocardiogram ordered for atrial flutter patient found to have decreased ejection fraction of 45-50%.  BNP minimally elevated on admission although did  receive aggressive fluid resuscitation as he came in with sepsis.  Monitor intake and output.  May benefit from cardiac work-up although this could likely be done as outpatient after he has recovered from COVID.  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  Appreciate diabetic coordinator help and have started some long-term acting insulin while on steroids. Continue Lyrica:  Fall precautions  Syncope and collapse Secondary to hypotension brought on by COVID.  PT to see.  Patient rehydrated.  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 Recheck labs in the morning.  Generalized weakness Secondary to COVID and hypoxia.  PT to see.  Personal history of colon cancer No acute issues suspected       Body mass index is 28.47 kg/m.        Consultants: None  Procedures: Echocardiogram noting ejection fraction of 45-50 %  Antimicrobials: Remdisivir 8/18 -present Paxlovid 8/17 - 8/18  Code Status: Full code   Subjective: Patient states breathing little bit better  Objective: Mild persistent tachycardia Vitals:   08/26/21 0500 08/26/21 1000  BP: 128/84 122/80  Pulse: (!) 114 (!) 101  Resp: 19 (!) 22  Temp: 98.6 F (37 C) 98.8 F (37.1 C)  SpO2: 92% 94%    Intake/Output Summary (Last 24 hours) at 08/26/2021 1453 Last data filed at 08/26/2021 0600 Gross per 24 hour    Intake 500 ml  Output 2050 ml  Net -1550 ml   Filed Weights   08/25/21 2037  Weight: 95.3 kg   Body mass index is 28.47 kg/m.  Exam:  General: Alert and oriented x3, no  acute distress HEENT: Normocephalic, atraumatic, mucous membranes are moist Cardiovascular: Irregular rhythm, borderline tachycardia Respiratory: Few Rales Abdomen: Soft, nontender, nondistended, positive bowel sounds Musculoskeletal: Clubbing or cyanosis or edema Skin: Baseline tears or lesions Psychiatry: Appropriate, no evidence of psychoses Neurology: No focal deficits  Data Reviewed: Procalcitonin level normal  Disposition:  Status is: Inpatient Remains inpatient appropriate because: Treatment of COVID, control of heart rate, diuresis if needed, evaluation by PT    Anticipated discharge date: 8/21  Family Communication: Left message for wife DVT Prophylaxis:  warfarin (COUMADIN) tablet 7.5 mg    Author:  K  ,MD 08/26/2021 2:53 PM  To reach On-call, see care teams to locate the attending and reach out via [email or web address]. Between 7PM-7AM, please contact night-coverage If you still have difficulty reaching the attending provider, please page the DOC (Director on Call) for [facility] on amion for assistance.  

## 2021-08-26 NOTE — Inpatient Diabetes Management (Signed)
Inpatient Diabetes Program Recommendations  AACE/ADA: New Consensus Statement on Inpatient Glycemic Control  Target Ranges:  Prepandial:   less than 140 mg/dL      Peak postprandial:   less than 180 mg/dL (1-2 hours)      Critically ill patients:  140 - 180 mg/dL    Latest Reference Range & Units 08/26/21 00:33 08/26/21 04:50 08/26/21 08:11  Glucose-Capillary 70 - 99 mg/dL 191 (H) 478 (H) 295 (H)    Latest Reference Range & Units 08/26/21 00:53  Hemoglobin A1C 4.8 - 5.6 % 7.2 (H)   Review of Glycemic Control  Diabetes history: DM2 Outpatient Diabetes medications: Metformin 500 mg BID Current orders for Inpatient glycemic control: Novolog 0-15 units Q4H; Solumedrol 47.5 mg Q12H  Inpatient Diabetes Program Recommendations:    Insulin: If steroids are continued as ordered, please consider ordering Levemir 10 units Q24H.  Thanks Orlando Penner, RN, MSN, CDCES Diabetes Coordinator Inpatient Diabetes Program [phone] (Team Pager from 8am to 5pm)

## 2021-08-26 NOTE — Consult Note (Signed)
Remdesivir - Pharmacy Brief Note  Assessment Daniel Owen is a 81 y.o. male who presented to the ED with a 3-day history of congestion and sore throat associated with generalized weakness and decreased energy. 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 COVID PCR positive. Per MD, Remdesivir will be started instead of Paxlovid as the patient is hypoxic. Pharmacy has been consulted to initiate and manage Remdesivir.   08/25/2021 ALT: 66 08/25/2021 CXR: No active disease SpO2: 98-100% >> 91-3% on 2L [place] 08/25/2021 Bcx: NG<12H 08/25/2021 Ucx: In progress   Plan:  Initiate Remdesivir 200 mg IVPB once followed by 100 mg IVPB daily x 4 days. Continue to monitor O2 and LFTs while on Remdesivir. Consider de-escalation to 3 days of therapy if there is significant improvement over the weekend.  Celene Squibb, PharmD PGY1 Pharmacy Resident 08/26/2021 8:56 AM

## 2021-08-26 NOTE — ED Notes (Addendum)
Patient was lying on his left arm at the time the BP cycled. BP cycled once more and was 95/69.

## 2021-08-26 NOTE — Progress Notes (Signed)
*  PRELIMINARY RESULTS* Echocardiogram 2D Echocardiogram has been performed.  Daniel Owen 08/26/2021, 10:33 AM

## 2021-08-26 NOTE — Assessment & Plan Note (Addendum)
Already on Coumadin for factor V Leiden deficiency history.  Still with some tachycardia so we will add beta-blocker.  Heart rate still slightly elevated, likely in the setting of COVID and infection.

## 2021-08-26 NOTE — Hospital Course (Addendum)
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  Patient has had a previous 3-day history of congestion and sore throat.  The emergency room, noted to be mildly hypotensive as well as hypoxic with oxygen saturations in the mid 80s requiring 2 L nasal cannula.  Work-up noteworthy for transaminitis, new onset atrial fibrillation with rapid ventricular rate and patient was COVID-positive.  Echocardiogram ordered noting decreased slightly decreased ejection fraction of 45 to 50%

## 2021-08-26 NOTE — ED Notes (Signed)
Pt brought to ED rm 38 at this time, this RN now assuming care.

## 2021-08-26 NOTE — ED Notes (Signed)
Informed RN bed assigned 

## 2021-08-26 NOTE — Assessment & Plan Note (Addendum)
New finding.  Echocardiogram ordered for atrial flutter patient found to have decreased ejection fraction of 45-50%.  BNP minimally elevated on admission although did receive aggressive fluid resuscitation as he came in with sepsis.  Monitor intake and output.  May benefit from cardiac work-up.  I will have patient follow-up with [facility] after he has recovered from COVID.

## 2021-08-26 NOTE — Assessment & Plan Note (Signed)
Meets criteria BMI greater than 25 

## 2021-08-26 NOTE — Assessment & Plan Note (Addendum)
Secondary to COVID and hypoxia.  Cleared by PT as patient has been quite ambulatory without assistance.

## 2021-08-27 DIAGNOSIS — F05 Delirium due to known physiological condition: Secondary | ICD-10-CM

## 2021-08-27 DIAGNOSIS — I4892 Unspecified atrial flutter: Secondary | ICD-10-CM | POA: Diagnosis not present

## 2021-08-27 DIAGNOSIS — F03918 Unspecified dementia, unspecified severity, with other behavioral disturbance: Secondary | ICD-10-CM | POA: Diagnosis not present

## 2021-08-27 DIAGNOSIS — I5022 Chronic systolic (congestive) heart failure: Secondary | ICD-10-CM | POA: Diagnosis not present

## 2021-08-27 DIAGNOSIS — U071 COVID-19: Secondary | ICD-10-CM | POA: Diagnosis not present

## 2021-08-27 LAB — LIPID PANEL
Cholesterol: 83 mg/dL (ref 0–200)
HDL: 31 mg/dL — ABNORMAL LOW (ref >40)
LDL Cholesterol: 41 mg/dL (ref 0–99)
Total CHOL/HDL Ratio: 2.7 RATIO
Triglycerides: 56 mg/dL (ref <150)
VLDL: 11 mg/dL (ref 0–40)

## 2021-08-27 LAB — GLUCOSE, CAPILLARY
Glucose-Capillary: 185 mg/dL — ABNORMAL HIGH (ref 70–99)
Glucose-Capillary: 187 mg/dL — ABNORMAL HIGH (ref 70–99)
Glucose-Capillary: 197 mg/dL — ABNORMAL HIGH (ref 70–99)
Glucose-Capillary: 201 mg/dL — ABNORMAL HIGH (ref 70–99)
Glucose-Capillary: 227 mg/dL — ABNORMAL HIGH (ref 70–99)
Glucose-Capillary: 232 mg/dL — ABNORMAL HIGH (ref 70–99)
Glucose-Capillary: 311 mg/dL — ABNORMAL HIGH (ref 70–99)
Glucose-Capillary: 329 mg/dL — ABNORMAL HIGH (ref 70–99)

## 2021-08-27 LAB — BASIC METABOLIC PANEL
Anion gap: 8 (ref 5–15)
BUN: 25 mg/dL — ABNORMAL HIGH (ref 8–23)
CO2: 23 mmol/L (ref 22–32)
Calcium: 8.4 mg/dL — ABNORMAL LOW (ref 8.9–10.3)
Chloride: 108 mmol/L (ref 98–111)
Creatinine, Ser: 0.77 mg/dL (ref 0.61–1.24)
GFR, Estimated: >60 mL/min (ref >60)
Glucose, Bld: 179 mg/dL — ABNORMAL HIGH (ref 70–99)
Potassium: 3.7 mmol/L (ref 3.5–5.1)
Sodium: 139 mmol/L (ref 135–145)

## 2021-08-27 LAB — PROTIME-INR
INR: 2 — ABNORMAL HIGH (ref 0.8–1.2)
Prothrombin Time: 22.4 seconds — ABNORMAL HIGH (ref 11.4–15.2)

## 2021-08-27 LAB — URINE CULTURE: Culture: <10000 — AB

## 2021-08-27 LAB — C-REACTIVE PROTEIN: CRP: 0.6 mg/dL (ref <1.0)

## 2021-08-27 MED ORDER — WARFARIN SODIUM 10 MG PO TABS
10.0000 mg | ORAL_TABLET | Freq: Once | ORAL | Status: AC
  Administered 2021-08-27: 10 mg via ORAL
  Filled 2021-08-27: qty 1

## 2021-08-27 MED ORDER — HALOPERIDOL LACTATE 5 MG/ML IJ SOLN
2.5000 mg | Freq: Once | INTRAMUSCULAR | Status: AC
  Administered 2021-08-27: 2.5 mg via INTRAVENOUS
  Filled 2021-08-27: qty 1

## 2021-08-27 MED ORDER — PREDNISONE 50 MG PO TABS
50.0000 mg | ORAL_TABLET | Freq: Every day | ORAL | Status: DC
  Administered 2021-08-28: 50 mg via ORAL
  Filled 2021-08-27: qty 1

## 2021-08-27 MED ORDER — FUROSEMIDE 10 MG/ML IJ SOLN
20.0000 mg | Freq: Every day | INTRAMUSCULAR | Status: DC
  Administered 2021-08-27 – 2021-08-28 (×2): 20 mg via INTRAVENOUS
  Filled 2021-08-27 (×2): qty 2

## 2021-08-27 MED ORDER — LORAZEPAM 2 MG/ML IJ SOLN
1.0000 mg | INTRAMUSCULAR | Status: DC | PRN
  Administered 2021-08-27 (×2): 1 mg via INTRAVENOUS
  Filled 2021-08-27 (×2): qty 1

## 2021-08-27 MED ORDER — HALOPERIDOL LACTATE 5 MG/ML IJ SOLN
2.5000 mg | Freq: Four times a day (QID) | INTRAMUSCULAR | Status: DC | PRN
  Administered 2021-08-27: 2.5 mg via INTRAVENOUS
  Filled 2021-08-27: qty 1

## 2021-08-27 NOTE — Progress Notes (Signed)
[place] ativan administration increased behavior disturbance and inability to redirect.  Ativan discontinued/ Dose of haldol ordered and will monitor response

## 2021-08-27 NOTE — Assessment & Plan Note (Signed)
According to family, patient gets occasionally mildly confused.  Although overall he is still rather independent.  He was alert and oriented and appropriate in the emergency room, but following transfer to the floor, patient quite confused thinking that he is at home and that his house is undergoing construction.  Many times he has to be redirected, especially when he tries to leave his room.  Recruitment consultant.  Likely this is a combination of change of environment and made worse by IV steroids.  Discussed with pharmacy.  We will plan to change him over to p.o. steroids starting 8/20.

## 2021-08-27 NOTE — Progress Notes (Signed)
Sitter order placed due to patient's ongoing confusion and restlessness.  IV haldol given with some improvement - patient somewhat less restless and more redirectable, but continues to be very confused.   Sitter at bedside and redirecting patient appropriately.

## 2021-08-27 NOTE — Progress Notes (Signed)
Patient initially pleasant and cooperative with mild disorientation to place and time at start of shift.   Took HS meds without difficulty.  As evening has progressed, patient has become more confused and now agitated.   Difficult to redirect and unable to re-orient.   Have attempted ambulating patient in hallway to help with re-orientation with no success.   Security called x1 to help patient back to bed.  MD notified, awaiting callback at this time.

## 2021-08-27 NOTE — Progress Notes (Signed)
Refusing to put telemetry back on

## 2021-08-27 NOTE — Progress Notes (Signed)
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  Patient has had a previous 3-day history of congestion and sore throat.  The emergency room, noted to be mildly hypotensive as well as hypoxic with oxygen saturations in the mid 80s requiring 2 L nasal cannula.  Work-up noteworthy for transaminitis, new onset atrial fibrillation with rapid ventricular rate and patient was COVID-positive.  Echocardiogram ordered noting decreased slightly decreased ejection fraction of 45 to 50%  Assessment and Plan: Assessment and Plan: * Acute hypoxemic respiratory failure due to COVID-19 (HCC)-resolved as of 08/27/2021 Patient initially required 2 L nasal cannula.  CT angiogram of chest notes no acute findings.  Given hypoxia, changed Paxlovid on admission over to Remdisivir.  Placed on IV steroids and nebulizers.    CRP level on 8/19 normal.  Discussed with pharmacy about changing steroids to p.o. starting 8/20.  We will also plan to decrease Remdisivir to 3-day course given rapid improvement, no longer hypoxic and normal CRP level.  Sepsis (HCC)-resolved as of 08/27/2021 Patient met criteria for sepsis severe sepsis on admission secondary to [place] secondary to COVID source, tachypnea, tachycardia and leukocytosis with lactic acidosis.  Sepsis stabilized patient aggressively fluid resuscitated.  Senile dementia, delirium, with behavioral disturbance ([place]) According to family, patient gets occasionally mildly confused.  Although overall he is still rather independent.  He was alert and oriented and  appropriate in the emergency room, but following transfer to the floor, patient quite confused thinking that he is at home and that his house is undergoing construction.  Many times he has to be redirected, especially when he tries to leave his room.  Air cabin crew.  Likely this is a combination of change of environment and made worse by IV steroids.  Discussed with pharmacy.  We will plan to change him over to p.o. steroids starting 8/20.   Atrial flutter with rapid ventricular response (HCC)-resolved as of 08/27/2021 Already on Coumadin for factor V Leiden deficiency history.  Still with some tachycardia so we will add beta-blocker.  Heart rate still slightly elevated, likely in the setting of COVID and infection.  Heart rate better.  Now back in normal sinus rhythm.  Outpatient cardiology follow-up.  See below.    Chronic systolic CHF (congestive heart failure) ([place]) New finding.  Echocardiogram ordered for atrial flutter patient found to have decreased ejection fraction of 45-50%.  BNP minimally elevated on admission although did receive aggressive fluid resuscitation as he came in with sepsis.  Monitor intake and output.  May benefit from cardiac work-up although this could likely be done as outpatient after he has recovered from [place].  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  Appreciate diabetic coordinator help and have started some long-term acting insulin while on steroids. Continue Lyrica:  Fall precautions  Syncope and collapse-resolved as of 08/27/2021 Secondary to hypotension brought on by COVID. \Patient rehydrated.  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  With pressures improving, will restart.  Transaminitis Mild transaminitis, likely related to acute COVID infection and shock liver from hypotension. Recheck labs in the morning.  Generalized weakness Secondary to COVID and hypoxia.  Cleared by PT as patient has been quite ambulatory without assistance.  Overweight (BMI 25.0-29.9) Meets criteria BMI greater than 25  Personal history of colon cancer No acute issues suspected       Body mass index is 28.03 kg/m.        Consultants: None  Procedures: Echocardiogram noting ejection fraction of 45-50 %  Antimicrobials: Remdisivir 8/18 -present Paxlovid 8/17 - 8/18  Code Status: Full code   Subjective: Patient feeling okay with no complaints although he is quite confused and thinks he is at home.  Objective: Mild persistent tachycardia Vitals:   08/27/21 1130 08/27/21 1549  BP: 120/67 (!) 143/78  Pulse: 61 86  Resp: 17 (!) 22  Temp: 97.6 F (36.4 C)   SpO2: 93% 99%    Intake/Output Summary (Last 24 hours) at 08/27/2021 1621 Last data filed at 08/27/2021 1515 Gross per 24 hour  Intake 572.13 ml  Output --  Net 572.13 ml    Filed Weights   08/25/21 2037 08/26/21 1922  Weight: 95.3 kg 93.8 kg   Body mass index is 28.03 kg/m.  Exam:  General: Alert and oriented x1, no acute distress HEENT: Normocephalic, atraumatic, mucous membranes are moist Cardiovascular: I regular rate and rhythm, S1-S2 Respiratory: Clear to quotation bilaterally Abdomen: Soft, nontender, nondistended, positive bowel sounds Musculoskeletal: Clubbing or cyanosis or edema Skin: Baseline tears or lesions Psychiatry: Appropriate, no evidence of psychoses Neurology: No focal deficits  Data Reviewed: CRP level at 0.6.  LDL at 41  Disposition:  Status is: Inpatient Remains inpatient appropriate because: Treatment of COVID, control of heart rate, diuresis if needed, evaluation by PT     Anticipated discharge date: 8/20  Family Communication: Updated wife by phone DVT Prophylaxis:   Coumadin    Author: Annita Brod ,MD 08/27/2021 4:21 PM  To reach On-call, see care teams to locate the attending and reach out via [email or web address]. Between 7PM-7AM, please contact night-coverage If you still have difficulty reaching the attending provider, please page the [facility] (Director on Call) for [facility] on amion for assistance.

## 2021-08-27 NOTE — Progress Notes (Signed)
PT Cancellation Note  Patient Details Name: Daniel Owen MRN: [id number] DOB: 08/29/1940   Cancelled Treatment:    Reason Eval/Treat Not Completed: Other (comment). Consult received and chart reviewed. Pt documented walking in room with nursing staff. Per chart, pt is at baseline level and staff are comfortable/willing to continue mobilizing. Will sign off at this time. Please re-order if needs change.   [name] 08/27/2021, 12:02 PM Elizabeth Palau, PT, DPT, GCS [phone]

## 2021-08-27 NOTE — Progress Notes (Signed)
Sitter Hailey in room with patient, stated patient more anxious trying to get out of room, pushed her a little to try and get out. Patient is positive for Covid, unable to ambulate in hall. Attempted to encourage patient to walk in room. Dr. Rito Ehrlich was made aware, ordered prn haldol for agitation. Will continue to monitor. Family was updated about patient status and aware of increased confusion which it not new per daughter Cala Bradford. Daughter is okay with given anxiety/agitation medication.

## 2021-08-27 NOTE — Progress Notes (Signed)
ANTICOAGULATION CONSULT NOTE - Initial Consult  Pharmacy Consult for Warfarin  Indication: atrial fibrillation,  Factor V (Leiden) deficiency, Hx of PE/DVT   Allergies  Allergen Reactions   Aspirin     Other reaction(s): Other (See Comments), Other (See Comments) Pt is unable to have aspirin products due to the adverse reaction to warfarin. Pt is unable to have aspirin products due to the adverse reaction to warfarin. Pt is unable to have aspirin products due to the adverse reaction to warfarin.    Ibuprofen     Other reaction(s): Other (See Comments), Other (See Comments) Affects clotting factors Affects clotting factors Affects clotting factors     Patient Measurements: Height: 6' (182.9 cm) Weight: 93.8 kg (206 lb 11.2 oz) IBW/kg (Calculated) : 77.6 Heparin Dosing Weight:   Vital Signs: Temp: 97.3 F (36.3 C) (08/19 0717) Temp Source: Axillary (08/19 0300) BP: 159/83 (08/19 0717) Pulse Rate: 64 (08/19 0717)  Labs: Recent Labs    08/25/21 2040 08/26/21 0053 08/27/21 0548  HGB 12.7*  --   --   HCT 38.5*  --   --   PLT 161  --   --   APTT  --  68*  --   LABPROT  --  23.4* 22.4*  INR  --  2.1* 2.0*  CREATININE 0.75  --  0.77  TROPONINIHS 13 15  --      Estimated Creatinine Clearance: 87.6 mL/min (by C-G formula based on SCr of 0.77 mg/dL).   Medical History: No past medical history on file.  Medications:  Medications Prior to Admission  Medication Sig Dispense Refill Last Dose   acetaminophen (TYLENOL) 500 MG tablet Take 1,000 mg by mouth every 6 (six) hours as needed for mild pain or fever.   08/25/2021 at 1100   allopurinol (ZYLOPRIM) 300 MG tablet Take 300 mg by mouth daily.   08/25/2021 at 1100   atorvastatin (LIPITOR) 40 MG tablet Take 40 mg by mouth daily.   08/25/2021 at pm   calcium citrate (CALCITRATE - DOSED IN MG ELEMENTAL CALCIUM) 950 (200 Ca) MG tablet Take 2 tablets by mouth 2 (two) times daily.   08/25/2021 at 1100   Cholecalciferol 25 MCG  (1000 UT) capsule Take 1,000 Units by mouth daily.   08/25/2021 at 1100   losartan (COZAAR) 25 MG tablet Take 25 mg by mouth daily.   08/25/2021 at 1100   metFORMIN (GLUCOPHAGE) 500 MG tablet Take 500 mg by mouth 2 (two) times daily.   08/25/2021 at 1100   potassium citrate (UROCIT-K) 10 MEQ (1080 MG) SR tablet Take 20 mEq by mouth 2 (two) times daily.   08/25/2021 at 1100   pregabalin (LYRICA) 75 MG capsule Take 75 mg by mouth 2 (two) times daily.   08/25/2021 at 1100   tamsulosin (FLOMAX) 0.4 MG CAPS capsule Take 0.4 mg by mouth daily.   08/25/2021 at 1100   warfarin (COUMADIN) 7.5 MG tablet Take 7.5 mg by mouth daily. Sunday, Monday, Wednesday and Friday takes 7.5 mg. Tuesday, Thursday, Saturday 3.75 mg   08/24/2021 at 1600    Assessment: Pharmacy consulted to dose warfarin for Afib in this 81 year old male admitted with respiratory failure due to COVID.  Pt was on Warfarin 7.5 mg PO daily, last dose 8/17 AM.   8/18: INR @ 0053 = 2.1, therapeutic  8/19: INR @ 0548 = 2.0, therapeutic  Goal of Therapy:  INR 2-3   Plan:  Will order of Warfarin 10 mg PO  x 1 today as patient's INR trending down Will check INR/CBC daily.   Yissel Habermehl A Aurelio Mccamy 08/27/2021,10:07 AM

## 2021-08-28 DIAGNOSIS — I5022 Chronic systolic (congestive) heart failure: Secondary | ICD-10-CM | POA: Diagnosis not present

## 2021-08-28 DIAGNOSIS — J9601 Acute respiratory failure with hypoxia: Secondary | ICD-10-CM | POA: Diagnosis not present

## 2021-08-28 DIAGNOSIS — I4892 Unspecified atrial flutter: Secondary | ICD-10-CM | POA: Diagnosis not present

## 2021-08-28 DIAGNOSIS — U071 COVID-19: Secondary | ICD-10-CM | POA: Diagnosis not present

## 2021-08-28 LAB — COMPREHENSIVE METABOLIC PANEL
ALT: 58 U/L — ABNORMAL HIGH (ref 0–44)
AST: 96 U/L — ABNORMAL HIGH (ref 15–41)
Albumin: 3.6 g/dL (ref 3.5–5.0)
Alkaline Phosphatase: 58 U/L (ref 38–126)
Anion gap: 5 (ref 5–15)
BUN: 30 mg/dL — ABNORMAL HIGH (ref 8–23)
CO2: 25 mmol/L (ref 22–32)
Calcium: 8.2 mg/dL — ABNORMAL LOW (ref 8.9–10.3)
Chloride: 109 mmol/L (ref 98–111)
Creatinine, Ser: 0.77 mg/dL (ref 0.61–1.24)
GFR, Estimated: >60 mL/min (ref >60)
Glucose, Bld: 212 mg/dL — ABNORMAL HIGH (ref 70–99)
Potassium: 3.8 mmol/L (ref 3.5–5.1)
Sodium: 139 mmol/L (ref 135–145)
Total Bilirubin: 0.7 mg/dL (ref 0.3–1.2)
Total Protein: 6.4 g/dL — ABNORMAL LOW (ref 6.5–8.1)

## 2021-08-28 LAB — PROTIME-INR
INR: 3.1 — ABNORMAL HIGH (ref 0.8–1.2)
Prothrombin Time: 32 seconds — ABNORMAL HIGH (ref 11.4–15.2)

## 2021-08-28 LAB — GLUCOSE, CAPILLARY
Glucose-Capillary: 238 mg/dL — ABNORMAL HIGH (ref 70–99)
Glucose-Capillary: 188 mg/dL — ABNORMAL HIGH (ref 70–99)
Glucose-Capillary: 228 mg/dL — ABNORMAL HIGH (ref 70–99)

## 2021-08-28 LAB — C-REACTIVE PROTEIN: CRP: 0.6 mg/dL (ref <1.0)

## 2021-08-28 MED ORDER — WARFARIN SODIUM 5 MG PO TABS
5.0000 mg | ORAL_TABLET | Freq: Once | ORAL | Status: DC
Start: 2021-08-28 — End: 2021-08-28
  Filled 2021-08-28: qty 1

## 2021-08-28 MED ORDER — PREDNISONE 10 MG PO TABS
ORAL_TABLET | ORAL | 0 refills | Status: AC
Start: 2021-08-28 — End: 2021-09-01

## 2021-08-28 NOTE — Discharge Summary (Signed)
Physician Discharge Summary   Patient: Daniel Owen MRN: [id number] DOB: [birth year]  Admit date:     08/25/2021  Discharge date: 08/28/21  Discharge Physician: Annita Brod   PCP: Albina Billet, MD   Recommendations at discharge:   New medication: Rapid prednisone taper Medication adjustment: Patient being discharged on 8/20 and tonight, he will take 5 mg of Coumadin followed by resuming his normal regimen on 8/21 Patient given referral to follow-up with cardiology in several weeks for evaluation of new findings of paroxysmal atrial fibrillation and mild systolic heart failure Patient will follow-up with his PCP in the next 1 month Recommend the patient stays in isolation for the next 7 days.  Should he come in to contact with anyone, recommend both people wear masks and patient can come out of isolation starting 8/27.  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)   Hypertension   Transaminitis   Generalized weakness   Overweight (BMI 25.0-29.9)  Principal Problem (Resolved):   Acute hypoxemic respiratory failure due to COVID-19 [facility]) Resolved Problems:   Sepsis ([place])   Atrial flutter with rapid ventricular response (HCC)   Syncope and collapse  Hospital Course: Patient is an 81 year old male with past medical history of 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  Patient has had a previous 3-day history of congestion and sore throat.  The emergency room, noted to be mildly hypotensive as well as hypoxic with oxygen saturations  in the mid 80s requiring 2 L nasal cannula.  Work-up noteworthy for transaminitis, new onset atrial fibrillation with rapid ventricular rate and patient was COVID-positive.  Echocardiogram ordered noting decreased slightly decreased ejection fraction of 45 to 50%  Assessment and Plan: * Acute hypoxemic respiratory failure due to COVID-19 (HCC)-resolved as of 08/27/2021 Patient initially required 2 L nasal cannula.  CT angiogram of chest notes no acute findings.  Given hypoxia, changed Paxlovid on admission over to Remdisivir.  Started on IV steroids and nebulizers.    CRP level on 8/19 normal.  Discussed with pharmacy about changing steroids to p.o. starting 8/20.  We will also plan to decrease Remdisivir to 3-day course (which patient completed on 8/20) given rapid improvement, no longer hypoxic and normal CRP level.  We will plan to discharge on quick steroid taper.  Sepsis (HCC)-resolved as of 08/27/2021 Patient met criteria for sepsis severe sepsis on admission secondary to [place] secondary to COVID source, tachypnea, tachycardia and leukocytosis with lactic acidosis.  Sepsis stabilized patient aggressively fluid resuscitated.  Senile dementia, delirium, with behavioral disturbance ([place]) According to family, patient gets occasionally mildly confused.  Although overall he is still rather independent.  He was alert and oriented and appropriate in the emergency room, but following transfer to the floor, patient quite confused thinking that he is at home and that his house is undergoing construction.  Many times he has to be redirected, especially when he tries to leave his room.  Air cabin crew.  Likely this is a combination of change of environment and made worse by IV steroids.  Changed over to p.o. steroids and  on quick steroid taper.  Recommended family have discussion with patient's primary care physician about whether he would benefit from being on a dementia medication such as Aricept or  Namenda.   Atrial flutter with rapid ventricular response (HCC)-resolved as of 08/27/2021 Already on Coumadin for factor V Leiden deficiency history.  Still with some tachycardia so we will add beta-blocker.  Heart rate still slightly elevated, likely in the setting of COVID and infection.  Heart rate better.  Now back in normal sinus rhythm.  Outpatient cardiology follow-up.  See below.    Chronic systolic CHF (congestive heart failure) ([place]) New finding.  Echocardiogram ordered for atrial flutter patient found to have decreased ejection fraction of 45-50%.  BNP minimally elevated on admission although did receive aggressive fluid resuscitation as he came in with sepsis.  Monitor intake and output.  May benefit from cardiac work-up.  I will have patient follow-up with [facility] after he has recovered from [place].  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  Appreciate diabetic coordinator help and have started some long-term acting insulin while on steroids. Continue Lyrica:  Fall precautions  Syncope and collapse-resolved as of 08/27/2021 Secondary to hypotension brought on by COVID. \Patient rehydrated.  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  On day of discharge, INR slightly 6 during patient's hospitalization, INR stable around 2 and on day of discharge, had jumped to 3.1.  Pharmacy recommended patient take 5 mg of Coumadin tonight, 8/20 which we will give this pill for patient to take home and then he will start his regular regimen on 8/21.   Hypertension Initially held losartan due to hypotension related to SIRS/sepsis.  With pressures improving, will restart.  Transaminitis Mild transaminitis, likely related  to acute COVID infection.  Still slightly elevated by day of discharge with AST of 96 and ALT of 58.  Recommend repeat lab checks when patient sees his PCP in the next 1 month  Generalized weakness Secondary to COVID and hypoxia.  Cleared by PT as patient has been quite ambulatory without assistance.  Overweight (BMI 25.0-29.9) Meets criteria BMI greater than 25  Personal history of colon cancer No acute issues suspected         Consultants: None Procedures performed: Echocardiogram noting ejection fraction of 45 to 50% Disposition: Home Diet recommendation:  Discharge Diet Orders (From admission, onward)     Start     Ordered   08/28/21 0000  Diet - low sodium heart healthy        08/28/21 1403           Cardiac diet DISCHARGE MEDICATION: Allergies as of 08/28/2021       Reactions   Aspirin    Other reaction(s): Other (See Comments), Other (See Comments) Pt is unable to have aspirin products due to the adverse reaction to warfarin. Pt is unable to have aspirin products due to the adverse reaction to warfarin. Pt is unable to have aspirin products due to the adverse reaction to warfarin.   Ibuprofen    Other reaction(s): Other (See Comments), Other (See Comments) Affects clotting factors Affects clotting factors Affects clotting factors        Medication List     TAKE these medications    acetaminophen 500 MG tablet Commonly known as:  TYLENOL Take 1,000 mg by mouth every 6 (six) hours as needed for mild pain or fever.   allopurinol 300 MG tablet Commonly known as: ZYLOPRIM Take 300 mg by mouth daily.   atorvastatin 40 MG tablet Commonly known as: LIPITOR Take 40 mg by mouth daily.   calcium citrate 950 (200 Ca) MG tablet Commonly known as: CALCITRATE - dosed in mg elemental calcium Take 2 tablets by mouth 2 (two) times daily.   Cholecalciferol 25 MCG (1000 UT) capsule Take 1,000 Units by mouth daily.   losartan 25 MG tablet Commonly known as:  COZAAR Take 25 mg by mouth daily.   metFORMIN 500 MG tablet Commonly known as: GLUCOPHAGE Take 500 mg by mouth 2 (two) times daily.   potassium citrate 10 MEQ (1080 MG) SR tablet Commonly known as: UROCIT-K Take 20 mEq by mouth 2 (two) times daily.   predniSONE 10 MG tablet Commonly known as: DELTASONE Take 4 tablets (40 mg total) by mouth daily with breakfast for 1 day, THEN 3 tablets (30 mg total) daily with breakfast for 1 day, THEN 2 tablets (20 mg total) daily with breakfast for 1 day, THEN 1 tablet (10 mg total) daily with breakfast for 1 day. Start taking on: August 28, 2021   pregabalin 75 MG capsule Commonly known as: LYRICA Take 75 mg by mouth 2 (two) times daily.   tamsulosin 0.4 MG Caps capsule Commonly known as: FLOMAX Take 0.4 mg by mouth daily.   warfarin 7.5 MG tablet Commonly known as: COUMADIN Take 7.5 mg by mouth daily. $RemoveBefo'Sunday, Monday, Wednesday and Friday takes 7.5 mg. Tuesday, Thursday, Saturday 3.75 mg        Follow-up Information     Tate, Denny C, MD. Schedule an appointment as soon as possible for a visit in 1 month(s).   Specialty: Internal Medicine Contact information: [address]   [place] [address] [phone]         End, Christopher, MD. Schedule an appointment as soon as possible for a visit in 2 week(s).   Specialty: Cardiology Contact information: [address] [place] [address] [phone]                Discharge Exam: Filed Weights   08/25/21 2037 08/26/21 1922  Weight: 95.3 kg 93.8 kg   General: Alert and oriented x1, no acute distress Cardiovascular: Regular rate and rhythm, S1-S2 Lungs: Clear to auscultation bilaterally   Condition at discharge: good  The results of significant diagnostics from this hospitalization (including imaging, microbiology, ancillary and laboratory) are listed below for reference.   Imaging Studies: ECHOCARDIOGRAM COMPLETE  Result Date:  08/26/2021    ECHOCARDIOGRAM REPORT   Patient Name:   Bennie F Stines Date of Exam: 08/26/2021 Medical Rec #:  [id number]      Height:       72.0 in Accession #:    [id number]     Weight:       210.0 lb Date of Birth:  09/15/1940      BSA:          2.175 m Patient Age:    80 years       BP:           128/84 mmHg Patient Gender: M              HR:           99'[id number]$  bpm. Exam Location:  [facility] Procedure: 2D Echo, Color Doppler  and Cardiac Doppler Indications:     I48.91 Atrial Fibrillation  History:         Patient has no prior history of Echocardiogram examinations.                  Signs/Symptoms:Syncope; Risk Factors:Diabetes. Hx of DVT/PE                  Pt tested positive for COVID-19 on 08/25/21.  Sonographer:     Charmayne Sheer Referring Phys:  [id number] Burg[address] Hackworth Wynelle Link Diagnosing Phys: Ida Rogue MD  Sonographer Comments: Suboptimal parasternal window and no subcostal window. IMPRESSIONS  1. Left ventricular ejection fraction, by estimation, is 45 to 50%. The left ventricle has mildly decreased function. The left ventricle demonstrates global hypokinesis. Left ventricular diastolic parameters are indeterminate.  2. Right ventricular systolic function is normal. The right ventricular size is normal.  3. The mitral valve is normal in structure. No evidence of mitral valve regurgitation. No evidence of mitral stenosis.  4. The aortic valve is normal in structure. Aortic valve regurgitation is not visualized. Aortic valve sclerosis/calcification is present, without any evidence of aortic stenosis.  5. The inferior vena cava is normal in size with greater than 50% respiratory variability, suggesting right atrial pressure of 3 mmHg. FINDINGS  Left Ventricle: Left ventricular ejection fraction, by estimation, is 45 to 50%. The left ventricle has mildly decreased function. The left ventricle demonstrates global hypokinesis. The left ventricular internal cavity size was normal in size. There is  no left ventricular hypertrophy.  Left ventricular diastolic parameters are indeterminate. Right Ventricle: The right ventricular size is normal. No increase in right ventricular wall thickness. Right ventricular systolic function is normal. Left Atrium: Left atrial size was normal in size. Right Atrium: Right atrial size was normal in size. Pericardium: There is no evidence of pericardial effusion. Mitral Valve: The mitral valve is normal in structure. No evidence of mitral valve regurgitation. No evidence of mitral valve stenosis. Tricuspid Valve: The tricuspid valve is normal in structure. Tricuspid valve regurgitation is not demonstrated. No evidence of tricuspid stenosis. Aortic Valve: The aortic valve is normal in structure. Aortic valve regurgitation is not visualized. Aortic valve sclerosis/calcification is present, without any evidence of aortic stenosis. Aortic valve mean gradient measures 3.0 mmHg. Aortic valve peak  gradient measures 4.8 mmHg. Aortic valve area, by VTI measures 2.51 cm. Pulmonic Valve: The pulmonic valve was normal in structure. Pulmonic valve regurgitation is not visualized. No evidence of pulmonic stenosis. Aorta: The aortic root is normal in size and structure. Venous: The inferior vena cava is normal in size with greater than 50% respiratory variability, suggesting right atrial pressure of 3 mmHg. IAS/Shunts: No atrial level shunt detected by color flow Doppler.  LEFT VENTRICLE PLAX 2D LVIDd:         3.15 cm LVIDs:         2.39 cm LV PW:         1.39 cm LV IVS:        1.04 cm LVOT diam:     1.90 cm LV SV:         44 LV SV Index:   20 LVOT Area:     2.84 cm  LV Volumes (MOD) LV vol d, MOD A2C: 101.0 ml LV vol d, MOD A4C: 84.4 ml LV vol s, MOD A2C: 56.9 ml LV vol s, MOD A4C: 44.2 ml LV SV MOD A2C:     44.1 ml LV SV MOD A4C:  84.4 ml LV SV MOD BP:      42.5 ml LEFT ATRIUM           Index LA diam:      3.70 cm 1.70 cm/m LA Vol (A4C): 23.1 ml 10.62 ml/m  AORTIC VALVE AV Area (Vmax):    2.49 cm AV Area (Vmean):    2.45 cm AV Area (VTI):     2.51 cm AV Vmax:           110.00 cm/s AV Vmean:          81.600 cm/s AV VTI:            0.174 m AV Peak Grad:      4.8 mmHg AV Mean Grad:      3.0 mmHg LVOT Vmax:         96.60 cm/s LVOT Vmean:        70.500 cm/s LVOT VTI:          0.154 m LVOT/AV VTI ratio: 0.89  AORTA Ao Root diam: 3.60 cm MITRAL VALVE MV Area (PHT): 6.12 cm    SHUNTS MV Decel Time: 124 msec    Systemic VTI:  0.15 m MV E velocity: 88.50 cm/s  Systemic Diam: 1.90 cm Ida Rogue MD Electronically signed by Ida Rogue MD Signature Date/Time: 08/26/2021/11:55:00 AM    Final    CT Angio Chest PE W and/or Wo Contrast  Result Date: 08/25/2021 CLINICAL DATA:  Syncopal episode PE suspected EXAM: CT ANGIOGRAPHY CHEST WITH CONTRAST TECHNIQUE: Multidetector CT imaging of the chest was performed using the standard protocol during bolus administration of intravenous contrast. Multiplanar CT image reconstructions and MIPs were obtained to evaluate the vascular anatomy. RADIATION DOSE REDUCTION: This exam was performed according to the departmental dose-optimization program which includes automated exposure control, adjustment of the mA and/or kV according to patient size and/or use of iterative reconstruction technique. CONTRAST:  21mL OMNIPAQUE IOHEXOL 350 MG/ML SOLN COMPARISON:  Radiographs earlier today and CT chest 04/07/2007 FINDINGS: Cardiovascular: Satisfactory opacification of the pulmonary arteries to the segmental level. No evidence of pulmonary embolism. Normal heart size. No pericardial effusion. Advanced coronary artery atherosclerotic calcification. Aortic calcification. Mediastinum/Nodes: No enlarged mediastinal, hilar, or axillary lymph nodes. Thyroid gland, trachea, and esophagus demonstrate no significant findings. Lungs/Pleura: Respiratory motion obscures fine detail. Bibasilar atelectasis/scarring. No focal consolidation, pleural effusion or pneumothorax. Upper Abdomen: No acute abnormality.  Musculoskeletal: No chest wall abnormality. No acute or significant osseous findings. Review of the MIP images confirms the above findings. IMPRESSION: Negative for acute pulmonary embolism. No acute findings in the chest. Advanced coronary artery atherosclerosis. Aortic Atherosclerosis (ICD10-I70.0). Electronically Signed   By: Placido Sou M.D.   On: 08/25/2021 22:45   DG Chest Port 1 View  Result Date: 08/25/2021 CLINICAL DATA:  Questionable sepsis - evaluate for abnormality EXAM: PORTABLE CHEST 1 VIEW COMPARISON:  Chest x-ray 12/15/2013 FINDINGS: The heart and mediastinal contours are unchanged. Persistent elevation of the left hemidiaphragm. No focal consolidation. No pulmonary edema. No pleural effusion. No pneumothorax. No acute osseous abnormality.  Old healed left rib fractures. IMPRESSION: No active disease. Electronically Signed   By: Iven Finn M.D.   On: 08/25/2021 21:26    Microbiology: Results for orders placed or performed during the hospital encounter of 08/25/21  Resp Panel by RT-PCR (Flu A&B, Covid) Anterior Nasal Swab     Status: Abnormal   Collection Time: 08/25/21  8:54 PM   Specimen: Anterior Nasal Swab  Result Value Ref Range  Status   SARS Coronavirus 2 by RT PCR POSITIVE (A) NEGATIVE Final    Comment: (NOTE) SARS-CoV-2 target nucleic acids are DETECTED.  The SARS-CoV-2 RNA is generally detectable in upper respiratory specimens during the acute phase of infection. Positive results are indicative of the presence of the identified virus, but do not rule out bacterial infection or co-infection with other pathogens not detected by the test. Clinical correlation with patient history and other diagnostic information is necessary to determine patient infection status. The expected result is Negative.  Fact Sheet for Patients: EntrepreneurPulse.com.au  Fact Sheet for Healthcare Providers: IncredibleEmployment.be  This test is  not yet approved or cleared by the [place] FDA and  has been authorized for detection and/or diagnosis of SARS-CoV-2 by FDA under an Emergency Use Authorization (EUA).  This EUA will remain in effect (meaning this test can be used) for the duration of  the COVID-19 declaration under Section 564(b)(1) of the A ct, 21 U.S.C. section 360bbb-3(b)(1), unless the authorization is terminated or revoked sooner.     Influenza A by PCR NEGATIVE NEGATIVE Final   Influenza B by PCR NEGATIVE NEGATIVE Final    Comment: (NOTE) The Xpert Xpress SARS-CoV-2/FLU/RSV plus assay is intended as an aid in the diagnosis of influenza from Nasopharyngeal swab specimens and should not be used as a sole basis for treatment. Nasal washings and aspirates are unacceptable for Xpert Xpress SARS-CoV-2/FLU/RSV testing.  Fact Sheet for Patients: EntrepreneurPulse.com.au  Fact Sheet for Healthcare Providers: IncredibleEmployment.be  This test is not yet approved or cleared by the [place] FDA and has been authorized for detection and/or diagnosis of SARS-CoV-2 by FDA under an Emergency Use Authorization (EUA). This EUA will remain in effect (meaning this test can be used) for the duration of the COVID-19 declaration under Section 564(b)(1) of the Act, 21 U.S.C. section 360bbb-3(b)(1), unless the authorization is terminated or revoked.  Performed at [facility], [address] [place], [place] [address]   Blood Culture (routine x 2)     Status: None (Preliminary result)   Collection Time: 08/25/21  8:54 PM   Specimen: BLOOD  Result Value Ref Range Status   Specimen Description BLOOD RIGHT ASSIST CONTROL  Final   Special Requests   Final    BOTTLES DRAWN AEROBIC AND ANAEROBIC Blood Culture results may not be optimal due to an inadequate volume of blood received in culture bottles   Culture   Final    NO GROWTH 3 DAYS Performed at [facility],  [address] [place], [place] [address]    Report Status PENDING  Incomplete  Blood Culture (routine x 2)     Status: None (Preliminary result)   Collection Time: 08/25/21  8:54 PM   Specimen: BLOOD  Result Value Ref Range Status   Specimen Description BLOOD RIGHT FOREARM  Final   Special Requests   Final    BOTTLES DRAWN AEROBIC AND ANAEROBIC Blood Culture results may not be optimal due to an inadequate volume of blood received in culture bottles   Culture   Final    NO GROWTH 3 DAYS Performed at [facility], [address], [place], [place] [address]    Report Status PENDING  Incomplete  Urine Culture     Status: Abnormal   Collection Time: 08/25/21 10:34 PM   Specimen: Urine, Random  Result Value Ref Range Status   Specimen Description   Final    URINE, RANDOM Performed at [facility], [address] [place], [place] [address]    Special Requests   Final    NONE Performed at [facility], [address] [place], [place] [address]    Culture (A)  Final    <10,000 COLONIES/mL INSIGNIFICANT GROWTH Performed at [facility] [address], [place], [place] [address]    Report Status 08/27/2021 FINAL  Final    Labs: CBC: Recent Labs  Lab 08/25/21 2040  WBC 6.4  NEUTROABS 4.4  HGB 12.7*  HCT 38.5*  MCV 90.4  PLT 086   Basic Metabolic Panel: Recent Labs  Lab 08/25/21 2040 08/27/21 0548 08/28/21 0646  NA 133* 139 139  K 3.6 3.7 3.8  CL 98 108 109  CO2 $Re'23 23 25  'klI$ GLUCOSE 181* 179* 212*  BUN 15 25* 30*  CREATININE 0.75 0.77 0.77  CALCIUM 8.7* 8.4* 8.2*   Liver Function Tests: Recent Labs  Lab 08/25/21 2040 08/28/21 0646  AST 84* 96*  ALT 66* 58*  ALKPHOS 71 58  BILITOT 1.1 0.7  PROT 7.2 6.4*  ALBUMIN 3.9 3.6   CBG: Recent Labs  Lab 08/27/21 1910 08/27/21 2313 08/28/21 0441 08/28/21 0803 08/28/21 1225  GLUCAP 311* 201* 238* 188* 228*    Discharge time spent: less than 30 minutes.  Signed: Annita Brod, MD [facility] 08/28/2021

## 2021-08-28 NOTE — Discharge Instructions (Signed)
Take 5 mg of Coumadin tonight, 8/20  Stay in isolation until 8/27.  If you are around anyone, both you and the other person should both wear masks.

## 2021-08-28 NOTE — Progress Notes (Signed)
ANTICOAGULATION CONSULT NOTE - Initial Consult  Pharmacy Consult for Warfarin  Indication: atrial fibrillation,  Factor V (Leiden) deficiency, Hx of PE/DVT   Allergies  Allergen Reactions   Aspirin     Other reaction(s): Other (See Comments), Other (See Comments) Pt is unable to have aspirin products due to the adverse reaction to warfarin. Pt is unable to have aspirin products due to the adverse reaction to warfarin. Pt is unable to have aspirin products due to the adverse reaction to warfarin.    Ibuprofen     Other reaction(s): Other (See Comments), Other (See Comments) Affects clotting factors Affects clotting factors Affects clotting factors     Patient Measurements: Height: 6' (182.9 cm) Weight: 93.8 kg (206 lb 11.2 oz) IBW/kg (Calculated) : 77.6 Heparin Dosing Weight:   Vital Signs: Temp: 98.2 F (36.8 C) (08/20 0300) BP: 136/80 (08/20 0300) Pulse Rate: 72 (08/20 0300)  Labs: Recent Labs    08/25/21 2040 08/26/21 0053 08/27/21 0548 08/28/21 0646  HGB 12.7*  --   --   --   HCT 38.5*  --   --   --   PLT 161  --   --   --   APTT  --  68*  --   --   LABPROT  --  23.4* 22.4* 32.0*  INR  --  2.1* 2.0* 3.1*  CREATININE 0.75  --  0.77 0.77  TROPONINIHS 13 15  --   --      Estimated Creatinine Clearance: 87.6 mL/min (by C-G formula based on SCr of 0.77 mg/dL).   Medical History: No past medical history on file.  Medications:  Medications Prior to Admission  Medication Sig Dispense Refill Last Dose   acetaminophen (TYLENOL) 500 MG tablet Take 1,000 mg by mouth every 6 (six) hours as needed for mild pain or fever.   08/25/2021 at 1100   allopurinol (ZYLOPRIM) 300 MG tablet Take 300 mg by mouth daily.   08/25/2021 at 1100   atorvastatin (LIPITOR) 40 MG tablet Take 40 mg by mouth daily.   08/25/2021 at pm   calcium citrate (CALCITRATE - DOSED IN MG ELEMENTAL CALCIUM) 950 (200 Ca) MG tablet Take 2 tablets by mouth 2 (two) times daily.   08/25/2021 at 1100    Cholecalciferol 25 MCG (1000 UT) capsule Take 1,000 Units by mouth daily.   08/25/2021 at 1100   losartan (COZAAR) 25 MG tablet Take 25 mg by mouth daily.   08/25/2021 at 1100   metFORMIN (GLUCOPHAGE) 500 MG tablet Take 500 mg by mouth 2 (two) times daily.   08/25/2021 at 1100   potassium citrate (UROCIT-K) 10 MEQ (1080 MG) SR tablet Take 20 mEq by mouth 2 (two) times daily.   08/25/2021 at 1100   pregabalin (LYRICA) 75 MG capsule Take 75 mg by mouth 2 (two) times daily.   08/25/2021 at 1100   tamsulosin (FLOMAX) 0.4 MG CAPS capsule Take 0.4 mg by mouth daily.   08/25/2021 at 1100   warfarin (COUMADIN) 7.5 MG tablet Take 7.5 mg by mouth daily. Sunday, Monday, Wednesday and Friday takes 7.5 mg. Tuesday, Thursday, Saturday 3.75 mg   08/24/2021 at 1600    Assessment: Pharmacy consulted to dose warfarin for Afib in this 81 year old male admitted with respiratory failure due to COVID.  Pt was on Warfarin 7.5 mg PO daily, last dose 8/17 AM.   8/18: INR  = 2.1, therapeutic  8/19: INR  = 2.0, therapeutic 8/20: INR  = 3.1,  supratherapeutic   Goal of Therapy:  INR 2-3   Plan:  Will order of Warfarin 5 mg PO x 1 today to equal 15mg  total over last 2 days, which equivocates to 7.5mg  average dose over the 2 days.  Will check INR/CBC daily.   Shalisa Mcquade A Elvin Banker 08/28/2021,8:57 AM

## 2021-08-31 LAB — CULTURE, BLOOD (ROUTINE X 2)
Culture: NO GROWTH
Culture: NO GROWTH

## 2021-09-06 NOTE — Progress Notes (Unsigned)
Cardiology Office Note  Date:  09/07/2021   ID:  Daniel Owen 1940/12/06, MRN [id number]  PCP:  Daniel Shaggy, MD   Chief Complaint  Patient presents with   New Patient (Initial Visit)    Patient had COVID on 08/24/2021; A-fib and mild systolic heart failure. Medications reviewed by the patient verbally.     HPI:  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   Was sitting on commode  Patient has had a previous 3-day history of congestion and sore throat.   The emergency room, noted to be mildly hypotensive as well as hypoxic with oxygen saturations in the mid 80s requiring 2 L nasal cannula.   Work-up noteworthy for transaminitis, new onset atrial fibrillation/flutter with rapid ventricular rate and patient was COVID-positive.    Echocardiogram August 26, 2021 in the setting of arrhythmia Ejection fraction 45 to 50%  In follow-up today reports he has recovered from COVID, feels well, great day today Ambulating better, good energy today No regular exercise program On losartan, not on any rate control or rhythm control agents On warfarin  Does not have blood pressure cuff for monitoring system at home  Several days ago with dizziness, wife gave him meclizine and he went to sleep, felt better Unclear if this was vertigo or orthostasis  EKG personally reviewed by myself on todays visit Normal sinus rhythm with PACs rate 78 bpm left anterior fascicular block  EKGs from hospital reviewed, possible atrial fibrillation  PMH:   has no past medical history on file.  PSH:   None recorded  Current Outpatient Medications  Medication Sig Dispense Refill   acetaminophen  (TYLENOL) 500 MG tablet Take 1,000 mg by mouth every 6 (six) hours as needed for mild pain or fever.     allopurinol (ZYLOPRIM) 300 MG tablet Take 300 mg by mouth daily.     atorvastatin (LIPITOR) 40 MG tablet Take 40 mg by mouth daily.     calcium citrate (CALCITRATE - DOSED IN MG ELEMENTAL CALCIUM) 950 (200 Ca) MG tablet Take 2 tablets by mouth 2 (two) times daily.     Cholecalciferol 25 MCG (1000 UT) capsule Take 1,000 Units by mouth daily.     losartan (COZAAR) 25 MG tablet Take 25 mg by mouth daily.     metFORMIN (GLUCOPHAGE) 500 MG tablet Take 500 mg by mouth 2 (two) times daily.     potassium citrate (UROCIT-K) 10 MEQ (1080 MG) SR tablet Take 20 mEq by mouth 2 (two) times daily.     pregabalin (LYRICA) 75 MG capsule Take 75 mg by mouth 2 (two) times daily.     tamsulosin (FLOMAX) 0.4 MG CAPS capsule Take 0.4 mg by mouth daily.     warfarin (COUMADIN) 7.5 MG tablet Take 7.5 mg by mouth daily. Sunday, Monday, Wednesday and Friday takes 7.5 mg. Tuesday, Thursday, Saturday 3.75 mg     No current facility-administered medications for this visit.    Allergies:   Aspirin and Ibuprofen   Social History:  The patient  reports that he quit smoking about 3 months ago. His smoking use included cigarettes and cigars. He has a 10.00 pack-year smoking history. His smokeless tobacco use includes chew. He reports  current alcohol use. He reports that he does not use drugs.   Family History:   family history is not on file.    Review of Systems: Review of Systems  Constitutional: Negative.   HENT: Negative.    Respiratory: Negative.    Cardiovascular: Negative.   Gastrointestinal: Negative.   Musculoskeletal: Negative.   Neurological: Negative.   Psychiatric/Behavioral: Negative.    All other systems reviewed and are negative.   PHYSICAL EXAM: VS:  BP 100/62 (BP Location: Right Arm, Patient Position: Sitting, Cuff Size: Normal)   Pulse 78   Ht 6' (1.829 m)   Wt 208 lb 6 oz (94.5 kg)    SpO2 98%   BMI 28.26 kg/m  , BMI Body mass index is 28.26 kg/m. GEN: Well nourished, well developed, in no acute distress HEENT: normal Neck: no JVD, carotid bruits, or masses Cardiac: RRR; no murmurs, rubs, or gallops,no edema  Respiratory:  clear to auscultation bilaterally, normal work of breathing GI: soft, nontender, nondistended, + BS MS: no deformity or atrophy Skin: warm and dry, no rash Neuro:  Strength and sensation are intact Psych: euthymic mood, full affect  Recent Labs: 08/25/2021: B Natriuretic Peptide 126.5; Hemoglobin 12.7; Platelets 161 08/28/2021: ALT 58; BUN 30; Creatinine, Ser 0.77; Potassium 3.8; Sodium 139    Lipid Panel Lab Results  Component Value Date   CHOL 83 08/27/2021   HDL 31 (L) 08/27/2021   LDLCALC 41 08/27/2021   TRIG 56 08/27/2021      Wt Readings from Last 3 Encounters:  09/07/21 208 lb 6 oz (94.5 kg)  08/26/21 206 lb 11.2 oz (93.8 kg)  04/05/16 211 lb (95.7 kg)     ASSESSMENT AND PLAN:  Problem List Items Addressed This Visit       Cardiology Problems   Chronic systolic CHF (congestive heart failure) (HCC) (Chronic)   Type 2 diabetes mellitus with circulatory disorder, without long-term current use of insulin (HCC)   Relevant Orders   EKG 12-Lead     Other   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)   Senile dementia, delirium, with behavioral disturbance (HCC)   COVID-19 virus infection   Relevant Orders   EKG 12-Lead   Other Visit Diagnoses     Dilated cardiomyopathy (HCC)    -  Primary   Relevant Orders   EKG 12-Lead   Atrial flutter, unspecified type (HCC)       Relevant Orders   EKG 12-Lead      Atrial fibrillation/flutter Noted on EKG in the hospital, on warfarin, back to normal sinus rhythm He is declining Zio monitor to look at arrhythmia burden Recommend home monitoring system with pulse oximeter and blood pressure cuff monitoring No further work-up needed  Cardiomyopathy Noted in the setting of  arrhythmia Underwhelmed by the findings, ejection fraction more closely 50 to 55% Definity was not used unfortunately No strong indication for repeat echo as he is feeling well, asymptomatic We will continue losartan for now and as he continues to have orthostasis symptoms  COVID Has recovered, had hypoxia, syncope on the commode, hypotension Feels he has made full recovery, energy back today Recommend he monitor blood pressure at home which it is still borderline low    Total encounter time more than 50 minutes  Greater than 50% was spent in counseling and coordination of care with the patient    Signed, Dossie Arbour, M.D., Ph.D. [facility] [place], [place] [phone]

## 2021-09-07 ENCOUNTER — Ambulatory Visit: Payer: Medicare Other | Admitting: Physician Assistant

## 2021-09-07 ENCOUNTER — Ambulatory Visit: Payer: Medicare Other | Attending: Cardiovascular Disease | Admitting: Cardiovascular Disease

## 2021-09-07 ENCOUNTER — Encounter: Payer: Self-pay | Admitting: Cardiovascular Disease

## 2021-09-07 VITALS — BP 100/62 | HR 78 | Ht 72.0 in | Wt 208.4 lb

## 2021-09-07 DIAGNOSIS — I4892 Unspecified atrial flutter: Secondary | ICD-10-CM | POA: Diagnosis not present

## 2021-09-07 DIAGNOSIS — U071 COVID-19: Secondary | ICD-10-CM | POA: Diagnosis not present

## 2021-09-07 DIAGNOSIS — F05 Delirium due to known physiological condition: Secondary | ICD-10-CM

## 2021-09-07 DIAGNOSIS — I420 Dilated cardiomyopathy: Secondary | ICD-10-CM

## 2021-09-07 DIAGNOSIS — F03918 Unspecified dementia, unspecified severity, with other behavioral disturbance: Secondary | ICD-10-CM

## 2021-09-07 DIAGNOSIS — I5022 Chronic systolic (congestive) heart failure: Secondary | ICD-10-CM

## 2021-09-07 DIAGNOSIS — E1159 Type 2 diabetes mellitus with other circulatory complications: Secondary | ICD-10-CM

## 2021-09-07 DIAGNOSIS — D682 Hereditary deficiency of other clotting factors: Secondary | ICD-10-CM

## 2021-09-07 NOTE — Patient Instructions (Addendum)
Medication Instructions:  No changes  If you need a refill on your cardiac medications before your next appointment, please call your pharmacy.   Lab work: No new labs needed  Testing/Procedures: No new testing needed  Follow-Up: At [facility], you and your health needs are our priority.  As part of our continuing mission to provide you with exceptional heart care, we have created designated Provider Care Teams.  These Care Teams include your primary Cardiologist (physician) and Advanced Practice Providers (APPs -  Physician Assistants and Nurse Practitioners) who all work together to provide you with the care you need, when you need it.  You will need a follow up appointment in 12 months  Providers on your designated Care Team:   Nicolasa Ducking, NP Eula Listen, PA-C Cadence Fransico Michael, [place]   Your physician has requested that you regularly monitor and record your blood pressure and heartrate readings at home. Please use the same machine at the same time of day to check your readings and record them.

## 2022-03-03 ENCOUNTER — Encounter (INDEPENDENT_AMBULATORY_CARE_PROVIDER_SITE_OTHER): Payer: Medicare Other | Admitting: Vascular Surgery

## 2022-03-24 ENCOUNTER — Ambulatory Visit (INDEPENDENT_AMBULATORY_CARE_PROVIDER_SITE_OTHER): Payer: Medicare Other | Admitting: Vascular Surgery

## 2022-03-24 ENCOUNTER — Encounter (INDEPENDENT_AMBULATORY_CARE_PROVIDER_SITE_OTHER): Payer: Self-pay | Admitting: Vascular Surgery

## 2022-03-24 VITALS — BP 94/60 | HR 72 | Resp 16 | Wt 214.0 lb

## 2022-03-24 DIAGNOSIS — D682 Hereditary deficiency of other clotting factors: Secondary | ICD-10-CM

## 2022-03-24 DIAGNOSIS — I6529 Occlusion and stenosis of unspecified carotid artery: Secondary | ICD-10-CM | POA: Insufficient documentation

## 2022-03-24 DIAGNOSIS — E1159 Type 2 diabetes mellitus with other circulatory complications: Secondary | ICD-10-CM

## 2022-03-24 DIAGNOSIS — I10 Essential (primary) hypertension: Secondary | ICD-10-CM

## 2022-03-24 DIAGNOSIS — I6523 Occlusion and stenosis of bilateral carotid arteries: Secondary | ICD-10-CM | POA: Diagnosis not present

## 2022-03-24 NOTE — H&P (View-Only) (Signed)
Patient ID: Daniel Owen, male   DOB: December 15, 1940, 82 y.o.   MRN: [id number]  Chief Complaint  Patient presents with   New Patient (Initial Visit)    Ref Daniel Owen consult recent CT shows approx 70% narrowing of the right internal carotid artery due to atherosclerotic plaque and some mixed plaque extending into right internal carotid    HPI Daniel Owen is a 82 y.o. male.  I am asked to see the patient by Dr. Hall Owen for evaluation of carotid stenosis.  Patient reports multiple episodes of severe dizziness resulting in loss of balance and falls.  This seems to be worsening over the past several months.  On 1 of these episodes about 3 months ago, he went to the [facility] and had an extensive workup.  At that time, he had a CT angiogram which demonstrated only 70% narrowing of the right internal carotid artery with possible ulceration.  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  These dizzy episodes seem to be getting worse.  He has not had a previous stroke to his knowledge.  He is on Coumadin for anticoagulation and is on a statin agent.     Past Medical History:  Diagnosis Date   Colon cancer ([place])    2013   Diabetes mellitus without complication ([place])    Hypertension    Pulmonary embolism ([place])    14 to 15 years ago    Past Surgical History Right foot surgery   Family History  Problem Relation Age of Onset   Arthritis/Rheumatoid Mother    Heart failure Mother    Dementia Father   No aneurysms   Social History   Tobacco Use   Smoking status: Former    Packs/day: 1.00    Years: 10.00    Additional pack years: 0.00    Total pack years: 10.00    Types: Cigarettes, Cigars    Quit date: 05/25/2021    Years since quitting: 0.8   Smokeless tobacco: Current    Types: Chew  Vaping Use   Vaping Use: Never used  Substance Use Topics   Alcohol use:  Yes    Comment: RARE   Drug use: Never     Allergies  Allergen Reactions   Aspirin     Other reaction(s): Other (See Comments), Other (See Comments) Pt is unable to have aspirin products due to the adverse reaction to warfarin. Pt is unable to have aspirin products due to the adverse reaction to warfarin. Pt is unable to have aspirin products due to the adverse reaction to warfarin.    Ibuprofen     Other reaction(s): Other (See Comments), Other (See Comments) Affects clotting factors Affects clotting factors Affects clotting factors     Current Outpatient Medications  Medication Sig Dispense Refill   acetaminophen (TYLENOL) 500 MG tablet Take 1,000 mg by mouth every 6 (six) hours as needed for mild Owen or fever.     allopurinol (ZYLOPRIM) 300 MG tablet Take 300 mg by mouth daily.     atorvastatin (LIPITOR) 40 MG tablet Take 40 mg by mouth daily.     calcium citrate (CALCITRATE - DOSED IN MG ELEMENTAL CALCIUM) 950 (200 Ca) MG tablet Take 2 tablets by mouth 2 (two) times daily.     Cholecalciferol 25 MCG (1000 UT) capsule Take 1,000 Units by mouth daily.  losartan (COZAAR) 25 MG tablet Take 25 mg by mouth daily.     metFORMIN (GLUCOPHAGE) 500 MG tablet Take 500 mg by mouth 2 (two) times daily.     potassium citrate (UROCIT-K) 10 MEQ (1080 MG) SR tablet Take 20 mEq by mouth 2 (two) times daily.     pregabalin (LYRICA) 75 MG capsule Take 75 mg by mouth 2 (two) times daily.     tamsulosin (FLOMAX) 0.4 MG CAPS capsule Take 0.4 mg by mouth daily.     warfarin (COUMADIN) 7.5 MG tablet Take 7.5 mg by mouth daily. Sunday, Monday, Wednesday and Friday takes 7.5 mg. Tuesday, Thursday, Saturday 3.75 mg     No current facility-administered medications for this visit.      REVIEW OF SYSTEMS (Negative unless checked)  Constitutional: [] Weight loss  [] Fever  [] Chills Cardiac: [] Chest Owen   [] Chest pressure   [] Palpitations   [] Shortness of breath when laying flat   [] Shortness of  breath at rest   [] Shortness of breath with exertion. Vascular:  [] Owen in legs with walking   [] Owen in legs at rest   [] Owen in legs when laying flat   [] Claudication   [] Owen in feet when walking  [] Owen in feet at rest  [] Owen in feet when laying flat   [x] History of DVT   [] Phlebitis   [x] Swelling in legs   [] Varicose veins   [] Non-healing ulcers Pulmonary:   [] Uses home oxygen   [] Productive cough   [] Hemoptysis   [] Wheeze  [] COPD   [] Asthma Neurologic:  [x] Dizziness  [] Blackouts   [] Seizures   [] History of stroke   [] History of TIA  [] Aphasia   [] Temporary blindness   [] Dysphagia   [] Weakness or numbness in arms   [] Weakness or numbness in legs Musculoskeletal:  [x] Arthritis   [] Joint swelling   [x] Joint Owen   [] Low back Owen Hematologic:  [] Easy bruising  [] Easy bleeding   [] Hypercoagulable state   [] Anemic  [] Hepatitis Gastrointestinal:  [] Blood in stool   [] Vomiting blood  [] Gastroesophageal reflux/heartburn   [] Abdominal Owen Genitourinary:  [] Chronic kidney disease   [] Difficult urination  [] Frequent urination  [] Burning with urination   [] Hematuria Skin:  [] Rashes   [] Ulcers   [] Wounds Psychological:  [] History of anxiety   []  History of major depression.    Physical Exam BP 94/60 (BP Location: Left Arm)   Pulse 72   Resp 16   Wt 214 lb (97.1 kg)   BMI 29.02 kg/m  Gen:  WD/WN, NAD. Appears younger than stated age. Head: [place]/AT, No temporalis wasting.  Ear/Nose/Throat: Hearing grossly intact, nares w/o erythema or drainage, oropharynx w/o Erythema/Exudate Eyes: Conjunctiva clear, sclera non-icteric  Neck: trachea midline.  No JVD.  Pulmonary:  Good air movement, respirations not labored, no use of accessory muscles  Cardiac: RRR, no JVD Vascular:  Vessel Right Left  Radial Palpable Palpable                                   Gastrointestinal:. No masses, surgical incisions, or scars. Musculoskeletal: M/S 5/5 throughout.  Extremities without ischemic changes.  No  deformity or atrophy. Mild LE edema. Neurologic: Sensation grossly intact in extremities.  Symmetrical.  Speech is fluent. Motor exam as listed above. Psychiatric: Judgment intact, Mood & affect appropriate for pt's clinical situation. Dermatologic: No rashes or ulcers noted.  No cellulitis or open wounds.    Radiology No results found.  Labs No  results found for this or any previous visit (from the past 2160 hour(s)).  Assessment/Plan:  Carotid stenosis he had a CT angiogram which demonstrated only 70% narrowing of the right internal carotid artery with possible ulceration.  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 We had a long discussion today with he and his wife regarding treatment options for his carotid disease.  He has at least a borderline high-grade lesion.  Although he has not had focal neurologic symptoms, he has worsening of persistent dizziness and unsteadiness.  Given his lesion and the high risk of stroke with a 70% or greater lesion, intervention can be considered even without symptoms.  I have discussed the differences between carotid stenting and carotid endarterectomy.  Discussed the reason and rationale for considering these as well as other options such as TCAR.  Discussed medical management which she is already on.  After a long discussion today, the patient and his wife are leaning towards proceeding with right carotid angiogram with possible stent placement the near future.  We discussed that we would add Plavix to his medical regimen for at least 90 days after the stent in addition to his warfarin.  He will continue his statin agent.  He is going to go home and discuss with his daughters but is planning on proceeding with right carotid stent placement.  Hypertension blood pressure control important in reducing the progression of atherosclerotic  disease. On appropriate oral medications.   Type 2 diabetes mellitus with circulatory disorder, without long-term current use of insulin (HCC) blood glucose control important in reducing the progression of atherosclerotic disease. Also, involved in wound healing. On appropriate medications.   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       Daniel Owen 03/24/2022, 12:35 PM   This note was created with Dragon medical transcription system.  Any errors from dictation are unintentional.

## 2022-03-24 NOTE — Assessment & Plan Note (Signed)
On anticoagulation 

## 2022-03-24 NOTE — Patient Instructions (Signed)
Carotid Angioplasty With Stent Carotid angioplasty with stent is a procedure to open or widen an artery in the neck (carotid artery) that has become narrowed. This is done by inflating a small balloon inside the artery and then placing a small piece of metal that looks like a coil or spring (stent) inside the artery. The stent helps keep the artery open by supporting the artery walls. The carotid arteries supply blood to the brain. When fats, cholesterol, and other materials (plaque) build up in an artery, the artery becomes narrow and can become blocked. This can reduce or block blood flow to certain areas of the brain, which can cause serious health problems, including stroke. The stent helps to keep the artery open so that blood can flow to the brain. Tell a health care provider about: Any allergies you have. All medicines you are taking, including vitamins, herbs, eye drops, creams, and over-the-counter medicines. Any problems you or family members have had with anesthesia. Any bleeding problems you have. Any surgeries you have had. Any medical conditions you have. Whether you are pregnant or may be pregnant. What are the risks? Your health care provider will talk with you about risks. These may include: Stroke. The stent becoming blocked. Problems at the access site, such as a large amount of blood collecting under your skin (hematoma). Allergic reactions to medicines or dyes. Damage to other structures or organs, or to the carotid artery. Infection. Heart attack. Death. This is rare. What happens before the procedure? Follow instructions from your health care provider about what you may eat and drink. Ask your health care provider about: Changing or stopping your regular medicines. These include any diabetes medicines or blood thinners (anticoagulants) you take. Whether aspirin or other blood thinners are recommended before this procedure. Taking over-the-counter medicines, vitamins,  herbs, and supplements. Do not use any products that contain nicotine or tobacco for at least 4 weeks before the procedure. These products include cigarettes, chewing tobacco, and vaping devices, such as e-cigarettes. If you need help quitting, ask your health care provider. Ask your health care provider: How your surgery site will be marked. What steps will be taken to help prevent infection. These steps may include washing skin with a soap that kills germs. You may have blood tests and imaging tests. What happens during the procedure?  An IV will be inserted into one of your veins. You may be given: A sedative. This helps you relax. Anesthesia. This will: Numb certain areas of your body. Make you fall asleep for surgery. Most commonly, an incision will be made in your groin. In some cases, an incision may be made in your wrist or forearm instead of your groin. A small, thin tube (catheter) will be inserted through an incision and into an artery. The catheter will be threaded upward into your carotid artery. An X-ray machine will help your health care provider guide the catheter to the correct place in your artery. Dye will be injected into the catheter and will travel to the narrow or blocked part of your carotid artery. X-ray images will be taken of how the dye flows through your artery. While the images are being taken, you may be given instructions about breathing, swallowing, moving, or talking. A filter will be inserted into your artery. This will be used to catch plaque that comes loose in your artery during the procedure. This reduces the risk of plaque moving into your brain. A small balloon will be inserted into your artery.   The balloon will be inflated for a few seconds to widen your artery and will then be removed. The stent will be placed in your artery. A second small balloon will be inserted into your artery and inflated. This expands the stent inside of your artery so that the  stent holds the artery walls open. The second balloon will then be removed. The catheter, filter, and first balloon will be removed from your artery and pressure will be held on your carotid artery to stop bleeding. Your incision may be closed with stitches (sutures), skin glue, or adhesive strips. A bandage (dressing) will be placed over your incision. The procedure may vary among health care providers and hospitals. What happens after the procedure? Your blood pressure, heart rate, breathing rate, and blood oxygen level will be monitored until you leave the hospital or clinic. Your mental status and movements (neurological status) will be monitored. You may need to have pressure placed on the incision site to prevent bleeding. You will need to keep the area still for a few hours, or as long as told by your health care provider. If the procedure was done in your groin, you will be told not to bend or cross your legs. Most people stay in the hospital overnight. This information is not intended to replace advice given to you by your health care provider. Make sure you discuss any questions you have with your health care provider. Document Revised: 05/24/2021 Document Reviewed: 05/24/2021 Elsevier Patient Education  2023 [employer]  

## 2022-03-24 NOTE — Assessment & Plan Note (Signed)
blood glucose control important in reducing the progression of atherosclerotic disease. Also, involved in wound healing. On appropriate medications.  

## 2022-03-24 NOTE — Assessment & Plan Note (Signed)
blood pressure control important in reducing the progression of atherosclerotic disease. On appropriate oral medications.  

## 2022-03-24 NOTE — Progress Notes (Unsigned)
Patient ID: Daniel Owen, male   DOB: 1940-05-29, 82 y.o.   MRN: [id number]  Chief Complaint  Patient presents with   New Patient (Initial Visit)    Ref Hall Busing consult recent CT shows approx 70% narrowing of the right internal carotid artery due to atherosclerotic plaque and some mixed plaque extending into right internal carotid    HPI Daniel Owen is a 82 y.o. male.  I am asked to see the patient by Dr. Hall Busing for evaluation of carotid stenosis.  Patient reports multiple episodes of severe dizziness resulting in loss of balance and falls.  This seems to be worsening over the past several months.  On 1 of these episodes about 3 months ago, he went to the [facility] ER and had an extensive workup.  At that time, he had a CT angiogram which demonstrated only 70% narrowing of the right internal carotid artery with possible ulceration.  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  These dizzy episodes seem to be getting worse.  He has not had a previous stroke to his knowledge.  He is on Coumadin for anticoagulation and is on a statin agent.     Past Medical History:  Diagnosis Date   Colon cancer ([place])    2013   Diabetes mellitus without complication ([place])    Hypertension    Pulmonary embolism ([place])    14 to 15 years ago    Past Surgical History Right foot surgery   Family History  Problem Relation Age of Onset   Arthritis/Rheumatoid Mother    Heart failure Mother    Dementia Father   No aneurysms   Social History   Tobacco Use   Smoking status: Former    Packs/day: 1.00    Years: 10.00    Additional pack years: 0.00    Total pack years: 10.00    Types: Cigarettes, Cigars    Quit date: 05/25/2021    Years since quitting: 0.8   Smokeless tobacco: Current    Types: Chew  Vaping Use   Vaping Use: Never used  Substance Use Topics   Alcohol use:  Yes    Comment: RARE   Drug use: Never     Allergies  Allergen Reactions   Aspirin     Other reaction(s): Other (See Comments), Other (See Comments) Pt is unable to have aspirin products due to the adverse reaction to warfarin. Pt is unable to have aspirin products due to the adverse reaction to warfarin. Pt is unable to have aspirin products due to the adverse reaction to warfarin.    Ibuprofen     Other reaction(s): Other (See Comments), Other (See Comments) Affects clotting factors Affects clotting factors Affects clotting factors     Current Outpatient Medications  Medication Sig Dispense Refill   acetaminophen (TYLENOL) 500 MG tablet Take 1,000 mg by mouth every 6 (six) hours as needed for mild pain or fever.     allopurinol (ZYLOPRIM) 300 MG tablet Take 300 mg by mouth daily.     atorvastatin (LIPITOR) 40 MG tablet Take 40 mg by mouth daily.     calcium citrate (CALCITRATE - DOSED IN MG ELEMENTAL CALCIUM) 950 (200 Ca) MG tablet Take 2 tablets by mouth 2 (two) times daily.     Cholecalciferol 25 MCG (1000 UT) capsule Take 1,000 Units by mouth daily.  losartan (COZAAR) 25 MG tablet Take 25 mg by mouth daily.     metFORMIN (GLUCOPHAGE) 500 MG tablet Take 500 mg by mouth 2 (two) times daily.     potassium citrate (UROCIT-K) 10 MEQ (1080 MG) SR tablet Take 20 mEq by mouth 2 (two) times daily.     pregabalin (LYRICA) 75 MG capsule Take 75 mg by mouth 2 (two) times daily.     tamsulosin (FLOMAX) 0.4 MG CAPS capsule Take 0.4 mg by mouth daily.     warfarin (COUMADIN) 7.5 MG tablet Take 7.5 mg by mouth daily. Sunday, Monday, Wednesday and Friday takes 7.5 mg. Tuesday, Thursday, Saturday 3.75 mg     No current facility-administered medications for this visit.      REVIEW OF SYSTEMS (Negative unless checked)  Constitutional: [] Weight loss  [] Fever  [] Chills Cardiac: [] Chest pain   [] Chest pressure   [] Palpitations   [] Shortness of breath when laying flat   [] Shortness of  breath at rest   [] Shortness of breath with exertion. Vascular:  [] Pain in legs with walking   [] Pain in legs at rest   [] Pain in legs when laying flat   [] Claudication   [] Pain in feet when walking  [] Pain in feet at rest  [] Pain in feet when laying flat   [x] History of DVT   [] Phlebitis   [x] Swelling in legs   [] Varicose veins   [] Non-healing ulcers Pulmonary:   [] Uses home oxygen   [] Productive cough   [] Hemoptysis   [] Wheeze  [] COPD   [] Asthma Neurologic:  [x] Dizziness  [] Blackouts   [] Seizures   [] History of stroke   [] History of TIA  [] Aphasia   [] Temporary blindness   [] Dysphagia   [] Weakness or numbness in arms   [] Weakness or numbness in legs Musculoskeletal:  [x] Arthritis   [] Joint swelling   [x] Joint pain   [] Low back pain Hematologic:  [] Easy bruising  [] Easy bleeding   [] Hypercoagulable state   [] Anemic  [] Hepatitis Gastrointestinal:  [] Blood in stool   [] Vomiting blood  [] Gastroesophageal reflux/heartburn   [] Abdominal pain Genitourinary:  [] Chronic kidney disease   [] Difficult urination  [] Frequent urination  [] Burning with urination   [] Hematuria Skin:  [] Rashes   [] Ulcers   [] Wounds Psychological:  [] History of anxiety   []  History of major depression.    Physical Exam BP 94/60 (BP Location: Left Arm)   Pulse 72   Resp 16   Wt 214 lb (97.1 kg)   BMI 29.02 kg/m  Gen:  WD/WN, NAD. Appears younger than stated age. Head: [place]/AT, No temporalis wasting.  Ear/Nose/Throat: Hearing grossly intact, nares w/o erythema or drainage, oropharynx w/o Erythema/Exudate Eyes: Conjunctiva clear, sclera non-icteric  Neck: trachea midline.  No JVD.  Pulmonary:  Good air movement, respirations not labored, no use of accessory muscles  Cardiac: RRR, no JVD Vascular:  Vessel Right Left  Radial Palpable Palpable                                   Gastrointestinal:. No masses, surgical incisions, or scars. Musculoskeletal: M/S 5/5 throughout.  Extremities without ischemic changes.  No  deformity or atrophy. Mild LE edema. Neurologic: Sensation grossly intact in extremities.  Symmetrical.  Speech is fluent. Motor exam as listed above. Psychiatric: Judgment intact, Mood & affect appropriate for pt's clinical situation. Dermatologic: No rashes or ulcers noted.  No cellulitis or open wounds.    Radiology No results found.  Labs No  results found for this or any previous visit (from the past 2160 hour(s)).  Assessment/Plan:  Carotid stenosis he had a CT angiogram which demonstrated only 70% narrowing of the right internal carotid artery with possible ulceration.  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 We had a long discussion today with he and his wife regarding treatment options for his carotid disease.  He has at least a borderline high-grade lesion.  Although he has not had focal neurologic symptoms, he has worsening of persistent dizziness and unsteadiness.  Given his lesion and the high risk of stroke with a 70% or greater lesion, intervention can be considered even without symptoms.  I have discussed the differences between carotid stenting and carotid endarterectomy.  Discussed the reason and rationale for considering these as well as other options such as TCAR.  Discussed medical management which she is already on.  After a long discussion today, the patient and his wife are leaning towards proceeding with right carotid angiogram with possible stent placement the near future.  We discussed that we would add Plavix to his medical regimen for at least 90 days after the stent in addition to his warfarin.  He will continue his statin agent.  He is going to go home and discuss with his daughters but is planning on proceeding with right carotid stent placement.  Hypertension blood pressure control important in reducing the progression of atherosclerotic  disease. On appropriate oral medications.   Type 2 diabetes mellitus with circulatory disorder, without long-term current use of insulin (HCC) blood glucose control important in reducing the progression of atherosclerotic disease. Also, involved in wound healing. On appropriate medications.   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       Leotis Pain 03/24/2022, 12:35 PM   This note was created with Dragon medical transcription system.  Any errors from dictation are unintentional.

## 2022-03-24 NOTE — Assessment & Plan Note (Signed)
he had a CT angiogram which demonstrated only 70% narrowing of the right internal carotid artery with possible ulceration.  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 We had a long discussion today with he and his wife regarding treatment options for his carotid disease.  He has at least a borderline high-grade lesion.  Although he has not had focal neurologic symptoms, he has worsening of persistent dizziness and unsteadiness.  Given his lesion and the high risk of stroke with a 70% or greater lesion, intervention can be considered even without symptoms.  I have discussed the differences between carotid stenting and carotid endarterectomy.  Discussed the reason and rationale for considering these as well as other options such as TCAR.  Discussed medical management which she is already on.  After a long discussion today, the patient and his wife are leaning towards proceeding with right carotid angiogram with possible stent placement the near future.  We discussed that we would add Plavix to his medical regimen for at least 90 days after the stent in addition to his warfarin.  He will continue his statin agent.  He is going to go home and discuss with his daughters but is planning on proceeding with right carotid stent placement.

## 2022-03-29 ENCOUNTER — Telehealth (INDEPENDENT_AMBULATORY_CARE_PROVIDER_SITE_OTHER): Payer: Self-pay

## 2022-03-29 NOTE — Telephone Encounter (Signed)
Daniel Owen daughter Maudie Mercury called saying he has had a rough morning and has been in bed all day and that's unlike him. He has been weak, very dizzy, and his BP is 143/83 and blood sugar level is 213 which is good for him. She wants to know is he okay to have surgery in the morning at 9: 30 am. Also, she wants to know will he be able to walk after surgery or should she be looking to get an hospital bed, being the main bedroom is upstairs, even though they have an elevator in the home.  Please advise

## 2022-03-29 NOTE — Telephone Encounter (Signed)
The patient should be fine.  He will be able to walk but he will be kept over night

## 2022-03-30 ENCOUNTER — Encounter: Payer: Self-pay | Admitting: Vascular Surgery

## 2022-03-30 ENCOUNTER — Inpatient Hospital Stay
Admission: RE | Admit: 2022-03-30 | Discharge: 2022-03-31 | DRG: 036 | Disposition: A | Discharge status: Home / Self Care | Payer: Medicare Other | Attending: Vascular Surgery | Admitting: Vascular Surgery

## 2022-03-30 ENCOUNTER — Other Ambulatory Visit: Payer: Self-pay

## 2022-03-30 ENCOUNTER — Encounter
Admission: RE | Disposition: A | Discharge status: Home / Self Care | Payer: Self-pay | Source: Home / Self Care | Attending: Vascular Surgery

## 2022-03-30 DIAGNOSIS — Z87891 Personal history of nicotine dependence: Secondary | ICD-10-CM

## 2022-03-30 DIAGNOSIS — I10 Essential (primary) hypertension: Secondary | ICD-10-CM | POA: Diagnosis present

## 2022-03-30 DIAGNOSIS — Z886 Allergy status to analgesic agent status: Secondary | ICD-10-CM

## 2022-03-30 DIAGNOSIS — I959 Hypotension, unspecified: Secondary | ICD-10-CM | POA: Diagnosis not present

## 2022-03-30 DIAGNOSIS — E1159 Type 2 diabetes mellitus with other circulatory complications: Secondary | ICD-10-CM | POA: Diagnosis present

## 2022-03-30 DIAGNOSIS — Z86718 Personal history of other venous thrombosis and embolism: Secondary | ICD-10-CM

## 2022-03-30 DIAGNOSIS — Z7984 Long term (current) use of oral hypoglycemic drugs: Secondary | ICD-10-CM | POA: Diagnosis not present

## 2022-03-30 DIAGNOSIS — I6521 Occlusion and stenosis of right carotid artery: Secondary | ICD-10-CM | POA: Diagnosis present

## 2022-03-30 DIAGNOSIS — Z79899 Other long term (current) drug therapy: Secondary | ICD-10-CM

## 2022-03-30 DIAGNOSIS — Z86711 Personal history of pulmonary embolism: Secondary | ICD-10-CM | POA: Diagnosis not present

## 2022-03-30 DIAGNOSIS — Z85038 Personal history of other malignant neoplasm of large intestine: Secondary | ICD-10-CM | POA: Diagnosis not present

## 2022-03-30 DIAGNOSIS — Z8249 Family history of ischemic heart disease and other diseases of the circulatory system: Secondary | ICD-10-CM | POA: Diagnosis not present

## 2022-03-30 DIAGNOSIS — Z7901 Long term (current) use of anticoagulants: Secondary | ICD-10-CM | POA: Diagnosis not present

## 2022-03-30 DIAGNOSIS — Z006 Encounter for examination for normal comparison and control in clinical research program: Secondary | ICD-10-CM

## 2022-03-30 DIAGNOSIS — I700 Atherosclerosis of aorta: Secondary | ICD-10-CM

## 2022-03-30 HISTORY — PX: CAROTID PTA/STENT INTERVENTION: CATH118231

## 2022-03-30 LAB — CREATININE, SERUM
Creatinine, Ser: 0.79 mg/dL (ref 0.61–1.24)
GFR, Estimated: >60 mL/min (ref >60)

## 2022-03-30 LAB — GLUCOSE, CAPILLARY
Glucose-Capillary: 158 mg/dL — ABNORMAL HIGH (ref 70–99)
Glucose-Capillary: 141 mg/dL — ABNORMAL HIGH (ref 70–99)
Glucose-Capillary: 184 mg/dL — ABNORMAL HIGH (ref 70–99)

## 2022-03-30 LAB — PROTIME-INR
INR: 2.9 — ABNORMAL HIGH (ref 0.8–1.2)
Prothrombin Time: 29.9 seconds — ABNORMAL HIGH (ref 11.4–15.2)

## 2022-03-30 LAB — POCT ACTIVATED CLOTTING TIME: Activated Clotting Time: 336 seconds

## 2022-03-30 LAB — MRSA NEXT GEN BY PCR, NASAL: MRSA by PCR Next Gen: NOT DETECTED

## 2022-03-30 LAB — BUN: BUN: 17 mg/dL (ref 8–23)

## 2022-03-30 SURGERY — CAROTID PTA/STENT INTERVENTION
Anesthesia: Moderate Sedation | Laterality: Right

## 2022-03-30 MED ORDER — PHENYLEPHRINE 80 MCG/ML (10ML) SYRINGE FOR IV PUSH (FOR BLOOD PRESSURE SUPPORT)
PREFILLED_SYRINGE | INTRAVENOUS | Status: AC
  Filled 2022-03-30: qty 10

## 2022-03-30 MED ORDER — FAMOTIDINE 20 MG PO TABS: 40.0000 mg | ORAL_TABLET | Freq: Once | ORAL | Status: DC | PRN

## 2022-03-30 MED ORDER — HEPARIN SODIUM (PORCINE) 1000 UNIT/ML IJ SOLN
INTRAMUSCULAR | Status: AC
  Filled 2022-03-30: qty 10

## 2022-03-30 MED ORDER — CEFAZOLIN SODIUM-DEXTROSE 2-4 GM/100ML-% IV SOLN
INTRAVENOUS | Status: AC
  Filled 2022-03-30: qty 100

## 2022-03-30 MED ORDER — MAGNESIUM SULFATE 2 GM/50ML IV SOLN: 2.0000 g | Freq: Every day | INTRAVENOUS | Status: DC | PRN

## 2022-03-30 MED ORDER — MIDAZOLAM HCL 2 MG/2ML IJ SOLN
INTRAMUSCULAR | Status: DC | PRN
  Administered 2022-03-30: .5 mg via INTRAVENOUS
  Administered 2022-03-30: 1 mg via INTRAVENOUS

## 2022-03-30 MED ORDER — IODIXANOL 320 MG/ML IV SOLN
INTRAVENOUS | Status: DC | PRN
  Administered 2022-03-30: 70 mL via INTRA_ARTERIAL

## 2022-03-30 MED ORDER — CEFAZOLIN SODIUM-DEXTROSE 2-4 GM/100ML-% IV SOLN
INTRAVENOUS | Status: AC
  Administered 2022-03-30: 2 g via INTRAVENOUS
  Filled 2022-03-30: qty 100

## 2022-03-30 MED ORDER — SODIUM CHLORIDE 0.9 % IV SOLN: INTRAVENOUS | Status: DC

## 2022-03-30 MED ORDER — MORPHINE SULFATE (PF) 4 MG/ML IV SOLN: 2.0000 mg | INTRAVENOUS | Status: DC | PRN

## 2022-03-30 MED ORDER — INSULIN ASPART 100 UNIT/ML IJ SOLN
0.0000 [IU] | Freq: Three times a day (TID) | INTRAMUSCULAR | Status: DC
  Administered 2022-03-31 (×2): 3 [IU] via SUBCUTANEOUS
  Administered 2022-03-31: 2 [IU] via SUBCUTANEOUS
  Filled 2022-03-30 (×3): qty 1

## 2022-03-30 MED ORDER — WARFARIN SODIUM 7.5 MG PO TABS: 7.5000 mg | ORAL_TABLET | Freq: Every day | ORAL | Status: DC

## 2022-03-30 MED ORDER — LOSARTAN POTASSIUM 50 MG PO TABS
25.0000 mg | ORAL_TABLET | Freq: Every day | ORAL | Status: DC
  Administered 2022-03-31: 25 mg via ORAL
  Filled 2022-03-30: qty 1

## 2022-03-30 MED ORDER — CEFAZOLIN SODIUM-DEXTROSE 2-4 GM/100ML-% IV SOLN: 2.0000 g | INTRAVENOUS | Status: AC

## 2022-03-30 MED ORDER — HYDRALAZINE HCL 20 MG/ML IJ SOLN: 5.0000 mg | INTRAMUSCULAR | Status: DC | PRN

## 2022-03-30 MED ORDER — FENTANYL CITRATE PF 50 MCG/ML IJ SOSY
PREFILLED_SYRINGE | INTRAMUSCULAR | Status: AC
  Filled 2022-03-30: qty 1

## 2022-03-30 MED ORDER — OXYCODONE-ACETAMINOPHEN 5-325 MG PO TABS: 1.0000 | ORAL_TABLET | ORAL | Status: DC | PRN

## 2022-03-30 MED ORDER — ALUM & MAG HYDROXIDE-SIMETH 200-200-20 MG/5ML PO SUSP: 15.0000 mL | ORAL | Status: DC | PRN

## 2022-03-30 MED ORDER — WARFARIN SODIUM 2.5 MG PO TABS: 3.7500 mg | ORAL_TABLET | ORAL | Status: DC

## 2022-03-30 MED ORDER — GUAIFENESIN-DM 100-10 MG/5ML PO SYRP: 15.0000 mL | ORAL_SOLUTION | ORAL | Status: DC | PRN

## 2022-03-30 MED ORDER — ACETAMINOPHEN 325 MG PO TABS: 325.0000 mg | ORAL_TABLET | ORAL | Status: DC | PRN

## 2022-03-30 MED ORDER — ACETAMINOPHEN 500 MG PO TABS: 1000.0000 mg | ORAL_TABLET | Freq: Four times a day (QID) | ORAL | Status: DC | PRN

## 2022-03-30 MED ORDER — PHENYLEPHRINE HCL-NACL 20-0.9 MG/250ML-% IV SOLN
0.0000 ug/min | INTRAVENOUS | Status: DC
  Administered 2022-03-30: 10 ug/min via INTRAVENOUS
  Administered 2022-03-30: 20 ug/min via INTRAVENOUS

## 2022-03-30 MED ORDER — CEFAZOLIN SODIUM-DEXTROSE 2-4 GM/100ML-% IV SOLN
2.0000 g | Freq: Three times a day (TID) | INTRAVENOUS | Status: AC
  Administered 2022-03-30 – 2022-03-31 (×2): 2 g via INTRAVENOUS
  Filled 2022-03-30 (×2): qty 100

## 2022-03-30 MED ORDER — SODIUM CHLORIDE 0.9 % IV SOLN
500.0000 mL | Freq: Once | INTRAVENOUS | Status: AC | PRN
  Administered 2022-03-30: 500 mL via INTRAVENOUS

## 2022-03-30 MED ORDER — ONDANSETRON HCL 4 MG/2ML IJ SOLN: 4.0000 mg | Freq: Four times a day (QID) | INTRAMUSCULAR | Status: DC | PRN

## 2022-03-30 MED ORDER — PHENOL 1.4 % MT LIQD: 1.0000 | OROMUCOSAL | Status: DC | PRN

## 2022-03-30 MED ORDER — PREGABALIN 75 MG PO CAPS
75.0000 mg | ORAL_CAPSULE | Freq: Two times a day (BID) | ORAL | Status: DC
  Administered 2022-03-30 – 2022-03-31 (×2): 75 mg via ORAL
  Filled 2022-03-30 (×2): qty 1

## 2022-03-30 MED ORDER — HYDROMORPHONE HCL 1 MG/ML IJ SOLN
INTRAMUSCULAR | Status: AC
  Filled 2022-03-30: qty 1

## 2022-03-30 MED ORDER — MIDAZOLAM HCL 2 MG/2ML IJ SOLN
INTRAMUSCULAR | Status: AC
  Filled 2022-03-30: qty 2

## 2022-03-30 MED ORDER — FAMOTIDINE IN NACL 20-0.9 MG/50ML-% IV SOLN
20.0000 mg | Freq: Two times a day (BID) | INTRAVENOUS | Status: DC
  Administered 2022-03-30 – 2022-03-31 (×3): 20 mg via INTRAVENOUS
  Filled 2022-03-30 (×4): qty 50

## 2022-03-30 MED ORDER — DOPAMINE-DEXTROSE 3.2-5 MG/ML-% IV SOLN
INTRAVENOUS | Status: AC
  Filled 2022-03-30: qty 250

## 2022-03-30 MED ORDER — MIDAZOLAM HCL 2 MG/ML PO SYRP: 8.0000 mg | ORAL_SOLUTION | Freq: Once | ORAL | Status: DC | PRN

## 2022-03-30 MED ORDER — METOPROLOL TARTRATE 5 MG/5ML IV SOLN: 2.0000 mg | INTRAVENOUS | Status: DC | PRN

## 2022-03-30 MED ORDER — WARFARIN - PHYSICIAN DOSING INPATIENT: Freq: Every day | Status: DC

## 2022-03-30 MED ORDER — WARFARIN SODIUM 7.5 MG PO TABS
7.5000 mg | ORAL_TABLET | ORAL | Status: DC
  Filled 2022-03-30: qty 1

## 2022-03-30 MED ORDER — MECLIZINE HCL 25 MG PO TABS: 25.0000 mg | ORAL_TABLET | Freq: Three times a day (TID) | ORAL | Status: DC | PRN

## 2022-03-30 MED ORDER — ORAL CARE MOUTH RINSE: 15.0000 mL | OROMUCOSAL | Status: DC | PRN

## 2022-03-30 MED ORDER — TAMSULOSIN HCL 0.4 MG PO CAPS
0.4000 mg | ORAL_CAPSULE | Freq: Every day | ORAL | Status: DC
  Administered 2022-03-31: 0.4 mg via ORAL
  Filled 2022-03-30: qty 1

## 2022-03-30 MED ORDER — PHENYLEPHRINE HCL-NACL 20-0.9 MG/250ML-% IV SOLN
INTRAVENOUS | Status: AC
  Filled 2022-03-30: qty 250

## 2022-03-30 MED ORDER — ACETAMINOPHEN 325 MG RE SUPP: 325.0000 mg | RECTAL | Status: DC | PRN

## 2022-03-30 MED ORDER — POTASSIUM CHLORIDE CRYS ER 20 MEQ PO TBCR: 20.0000 meq | EXTENDED_RELEASE_TABLET | Freq: Every day | ORAL | Status: DC | PRN

## 2022-03-30 MED ORDER — CALCIUM CITRATE 950 (200 CA) MG PO TABS
2.0000 | ORAL_TABLET | Freq: Two times a day (BID) | ORAL | Status: DC
  Administered 2022-03-30 – 2022-03-31 (×2): 400 mg via ORAL
  Filled 2022-03-30 (×3): qty 2

## 2022-03-30 MED ORDER — ATORVASTATIN CALCIUM 20 MG PO TABS
40.0000 mg | ORAL_TABLET | Freq: Every day | ORAL | Status: DC
  Administered 2022-03-30 – 2022-03-31 (×2): 40 mg via ORAL
  Filled 2022-03-30 (×2): qty 2

## 2022-03-30 MED ORDER — ATROPINE SULFATE 1 MG/10ML IJ SOSY
PREFILLED_SYRINGE | INTRAMUSCULAR | Status: AC
  Filled 2022-03-30: qty 20

## 2022-03-30 MED ORDER — HEPARIN SODIUM (PORCINE) 1000 UNIT/ML IJ SOLN
INTRAMUSCULAR | Status: DC | PRN
  Administered 2022-03-30: 8000 [IU] via INTRAVENOUS

## 2022-03-30 MED ORDER — POTASSIUM CITRATE ER 10 MEQ (1080 MG) PO TBCR
20.0000 meq | EXTENDED_RELEASE_TABLET | Freq: Two times a day (BID) | ORAL | Status: DC
  Administered 2022-03-30 – 2022-03-31 (×2): 20 meq via ORAL
  Filled 2022-03-30 (×3): qty 2

## 2022-03-30 MED ORDER — ATROPINE SULFATE 1 MG/ML IV SOLN
INTRAVENOUS | Status: DC | PRN
  Administered 2022-03-30: 1 mg via INTRAVENOUS

## 2022-03-30 MED ORDER — ONDANSETRON HCL 4 MG/2ML IJ SOLN
4.0000 mg | Freq: Four times a day (QID) | INTRAMUSCULAR | Status: DC | PRN
Start: 2022-03-30 — End: 2022-03-31

## 2022-03-30 MED ORDER — CLOPIDOGREL BISULFATE 75 MG PO TABS
75.0000 mg | ORAL_TABLET | Freq: Every day | ORAL | Status: DC
  Administered 2022-03-31: 75 mg via ORAL
  Filled 2022-03-30: qty 1

## 2022-03-30 MED ORDER — DIPHENHYDRAMINE HCL 50 MG/ML IJ SOLN: 50.0000 mg | Freq: Once | INTRAMUSCULAR | Status: DC | PRN

## 2022-03-30 MED ORDER — LABETALOL HCL 5 MG/ML IV SOLN: 10.0000 mg | INTRAVENOUS | Status: DC | PRN

## 2022-03-30 MED ORDER — FENTANYL CITRATE (PF) 100 MCG/2ML IJ SOLN
INTRAMUSCULAR | Status: DC | PRN
  Administered 2022-03-30: 50 ug via INTRAVENOUS
  Administered 2022-03-30: 25 ug via INTRAVENOUS

## 2022-03-30 MED ORDER — METHYLPREDNISOLONE SODIUM SUCC 125 MG IJ SOLR
125.0000 mg | Freq: Once | INTRAMUSCULAR | Status: DC | PRN
Start: 2022-03-30 — End: 2022-03-30

## 2022-03-30 MED ORDER — HYDROMORPHONE HCL 1 MG/ML IJ SOLN
1.0000 mg | Freq: Once | INTRAMUSCULAR | Status: AC | PRN
  Administered 2022-03-30: 1 mg via INTRAVENOUS

## 2022-03-30 SURGICAL SUPPLY — 19 items
BALLN VTRAC 4.5X30X135 (BALLOONS) ×1
BALLOON VTRAC 4.5X30X135 (BALLOONS) IMPLANT
CATH ANGIO 5F PIGTAIL 100CM (CATHETERS) IMPLANT
CATH HEADHUNTER H1 5F 100CM (CATHETERS) IMPLANT
COVER DRAPE FLUORO 36X44 (DRAPES) IMPLANT
COVER PROBE ULTRASOUND 5X96 (MISCELLANEOUS) IMPLANT
DEVICE EMBOSHIELD NAV6 4.0-7.0 (FILTER) IMPLANT
DEVICE SAFEGUARD 24CM (GAUZE/BANDAGES/DRESSINGS) IMPLANT
DEVICE STARCLOSE SE CLOSURE (Vascular Products) IMPLANT
GLIDEWIRE ANGLED SS 035X260CM (WIRE) IMPLANT
GUIDEWIRE VASC STIFF .038X260 (WIRE) IMPLANT
KIT CAROTID MANIFOLD (MISCELLANEOUS) IMPLANT
KIT ENCORE 26 ADVANTAGE (KITS) IMPLANT
PACK ANGIOGRAPHY (CUSTOM PROCEDURE TRAY) ×1 IMPLANT
SHEATH BRITE TIP 5FRX11 (SHEATH) IMPLANT
SHEATH SHUTTLE SELECT 6F (SHEATH) IMPLANT
STENT XACT CAR 9-7X40X136 (Permanent Stent) IMPLANT
SYR MEDRAD MARK 7 150ML (SYRINGE) IMPLANT
WIRE GUIDERIGHT .035X150 (WIRE) IMPLANT

## 2022-03-30 NOTE — Telephone Encounter (Signed)
Message given to patients daughter. They are headed to the hospital for surgery and his up and feeling better this morning.

## 2022-03-30 NOTE — Op Note (Signed)
OPERATIVE NOTE DATE: 03/30/2022  PROCEDURE:  Ultrasound guidance for vascular access right femoral artery  Placement of a 9 mm proximal 7 mm distal, 4 cm long Exact stent with the use of the NAV-6 embolic protection device in the right carotid artery  PRE-OPERATIVE DIAGNOSIS: 1. 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   85% proximal right internal carotid artery stenosis with a huge ulcer present  SPECIMEN(S):   none  INDICATIONS:   Patient is a 82 y.o. male who presents with a high-grade ulcerated right carotid artery stenosis.  I have completed Share Decision Making with Daniel Owen prior to surgery.  Conversations included: -Discussion of all treatment options including carotid endarterectomy (CEA), CAS (which includes transcarotid artery revascularization (TCAR)), and optimal medical therapy (OMT)). -Explanation of risks and benefits for each option specific to Owen clinical situation. -Integration of clinical guidelines as it relates to the patient's history and co-morbidities -Discussion and incorporation of Daniel Owen and their personal preferences and priorities in choosing a treatment plan.   Risks and benefits were discussed and informed consent was obtained.   DESCRIPTION: After obtaining full informed written consent, the patient was brought back to the vascular suite and placed supine upon the table.  The patient received IV antibiotics prior to induction. Moderate conscious sedation was administered during a face to face encounter with the patient throughout the procedure with my supervision of the RN administering  medicines and monitoring the patients vital signs and mental status throughout from the start of the procedure until the patient was taken to the recovery room.  After obtaining adequate anesthesia, the patient was prepped and draped in the standard fashion.   The right femoral artery was visualized with ultrasound and found to be widely patent. It was then accessed under direct ultrasound guidance without difficulty with a Seldinger needle. A permanent image was recorded. A J-wire was placed and we then placed a 6 French sheath. The patient was then heparinized and a total of 8000 units of intravenous heparin were given and an ACT was checked to confirm successful anticoagulation. A pigtail catheter was then placed into the ascending aorta. This showed a type II aortic arch without proximal stenosis. I then selectively cannulated the innominate artery without difficulty with a headhunter catheter and advanced into the mid mid right common carotid artery.  Cervical and cerebral carotid angiography was then performed. There were no obvious intracranial filling defects with essentially no cross-filling right to left. The carotid bifurcation demonstrated 85% proximal right internal carotid artery stenosis with a huge ulcer present.  I then advanced into the external carotid artery with a Glidewire and the headhunter catheter and then exchanged for the Amplatz Super Stiff wire. Over the Amplatz Super Stiff wire, a 6 [place] shuttle sheath was placed into the mid common carotid artery. I then used the NAV-6  Embolic protection device and crossed the lesion and parked this in the distal internal carotid artery at the base of the skull.  I then selected a 9 mm proximal, 7 mm distal, 4 cm long exact stent. This was deployed across the lesion encompassing it  in its entirety. A 4.5 mm diameter by 3 cm length balloon was used to post dilate the stent. Only about a 15% residual stenosis was present after angioplasty. Completion  angiogram showed normal intracranial filling without new defects. At this point I elected to terminate the procedure. The sheath was removed and StarClose closure device was deployed in the right femoral artery with excellent hemostatic result. The patient was taken to the recovery room in stable condition having tolerated the procedure well.  COMPLICATIONS: none  CONDITION: stable  Daniel Owen 03/30/2022 12:45 PM   This note was created with Dragon Medical transcription system. Any errors in dictation are purely unintentional.

## 2022-03-30 NOTE — Interval H&P Note (Signed)
History and Physical Interval Note:  03/30/2022 10:06 AM  Daniel Owen  has presented today for surgery, with the diagnosis of R Carotid Stent   ABBOTT   Carotid artery stenosis.  The various methods of treatment have been discussed with the patient and family. After consideration of risks, benefits and other options for treatment, the patient has consented to  Procedure(s): CAROTID PTA/STENT INTERVENTION (Right) as a surgical intervention.  The patient's history has been reviewed, patient examined, no change in status, stable for surgery.  I have reviewed the patient's chart and labs.  Questions were answered to the patient's satisfaction.     Leotis Pain

## 2022-03-31 ENCOUNTER — Encounter: Payer: Self-pay | Admitting: Vascular Surgery

## 2022-03-31 DIAGNOSIS — Z95828 Presence of other vascular implants and grafts: Secondary | ICD-10-CM

## 2022-03-31 DIAGNOSIS — Z9889 Other specified postprocedural states: Secondary | ICD-10-CM

## 2022-03-31 LAB — BASIC METABOLIC PANEL
Anion gap: 12 (ref 5–15)
BUN: 16 mg/dL (ref 8–23)
CO2: 25 mmol/L (ref 22–32)
Calcium: 8.4 mg/dL — ABNORMAL LOW (ref 8.9–10.3)
Chloride: 105 mmol/L (ref 98–111)
Creatinine, Ser: 0.65 mg/dL (ref 0.61–1.24)
GFR, Estimated: >60 mL/min (ref >60)
Glucose, Bld: 151 mg/dL — ABNORMAL HIGH (ref 70–99)
Potassium: 3.9 mmol/L (ref 3.5–5.1)
Sodium: 142 mmol/L (ref 135–145)

## 2022-03-31 LAB — PROTIME-INR
INR: 2.2 — ABNORMAL HIGH (ref 0.8–1.2)
Prothrombin Time: 24.2 seconds — ABNORMAL HIGH (ref 11.4–15.2)

## 2022-03-31 LAB — CBC
HCT: 33.2 % — ABNORMAL LOW (ref 39.0–52.0)
Hemoglobin: 10.8 g/dL — ABNORMAL LOW (ref 13.0–17.0)
MCH: 30.3 pg (ref 26.0–34.0)
MCHC: 32.5 g/dL (ref 30.0–36.0)
MCV: 93.3 fL (ref 80.0–100.0)
Platelets: 192 10*3/uL (ref 150–400)
RBC: 3.56 MIL/uL — ABNORMAL LOW (ref 4.22–5.81)
RDW: 14.4 % (ref 11.5–15.5)
WBC: 8.1 10*3/uL (ref 4.0–10.5)
nRBC: 0 % (ref 0.0–0.2)

## 2022-03-31 LAB — GLUCOSE, CAPILLARY
Glucose-Capillary: 154 mg/dL — ABNORMAL HIGH (ref 70–99)
Glucose-Capillary: 172 mg/dL — ABNORMAL HIGH (ref 70–99)
Glucose-Capillary: 139 mg/dL — ABNORMAL HIGH (ref 70–99)

## 2022-03-31 MED ORDER — CHLORHEXIDINE GLUCONATE CLOTH 2 % EX PADS: 6.0000 | MEDICATED_PAD | Freq: Every day | CUTANEOUS | Status: DC

## 2022-03-31 MED ORDER — WARFARIN - PHARMACIST DOSING INPATIENT: Freq: Every day | Status: DC

## 2022-03-31 MED ORDER — OXYCODONE-ACETAMINOPHEN 5-325 MG PO TABS: 1.0000 | ORAL_TABLET | ORAL | 0 refills | Status: DC | PRN

## 2022-03-31 MED ORDER — CLOPIDOGREL BISULFATE 75 MG PO TABS: 75.0000 mg | ORAL_TABLET | Freq: Every day | ORAL | 3 refills | Status: DC

## 2022-03-31 MED ORDER — WARFARIN SODIUM 7.5 MG PO TABS
7.5000 mg | ORAL_TABLET | Freq: Once | ORAL | Status: AC
  Administered 2022-03-31: 7.5 mg via ORAL
  Filled 2022-03-31: qty 1

## 2022-03-31 NOTE — Progress Notes (Signed)
[place] for warfarin dosing/monitoring Indication: Factor V deficiency   Allergies  Allergen Reactions   Aspirin     Other reaction(s): Other (See Comments), Other (See Comments) Pt is unable to have aspirin products due to the adverse reaction to warfarin. Pt is unable to have aspirin products due to the adverse reaction to warfarin. Pt is unable to have aspirin products due to the adverse reaction to warfarin.    Ibuprofen     Other reaction(s): Other (See Comments), Other (See Comments) Affects clotting factors Affects clotting factors Affects clotting factors     Patient Measurements: Height: 6' (182.9 cm) Weight: 93 kg (205 lb) IBW/kg (Calculated) : 77.6  Vital Signs: Temp: 97.8 F (36.6 C) (03/22 0745) Temp Source: Oral (03/22 0745) BP: 104/57 (03/22 1200) Pulse Rate: 61 (03/22 0900)  Labs: Recent Labs    03/30/22 1058 03/31/22 0723  HGB  --  10.8*  HCT  --  33.2*  PLT  --  192  LABPROT 29.9* 24.2*  INR 2.9* 2.2*  CREATININE 0.79 0.65    Estimated Creatinine Clearance: 79.5 mL/min (by C-G formula based on SCr of 0.65 mg/dL).   Medical History: Past Medical History:  Diagnosis Date   Colon cancer ([place])    2013   Diabetes mellitus without complication ([place])    Hypertension    Pulmonary embolism ([facility])    14 to 15 years ago    Medications:  Scheduled:   atorvastatin  40 mg Oral Daily   calcium citrate  2 tablet Oral BID   Chlorhexidine Gluconate Cloth  6 each Topical Daily   clopidogrel  75 mg Oral Q0600   insulin aspart  0-15 Units Subcutaneous TID WC   losartan  25 mg Oral Daily   potassium citrate  20 mEq Oral BID   pregabalin  75 mg Oral BID   tamsulosin  0.4 mg Oral Daily   [START ON 04/01/2022] warfarin  3.75 mg Oral Once per day on Tue Thu Sat   warfarin  7.5 mg Oral Once per day on Sun Mon Wed Fri   Warfarin - Physician Dosing Inpatient   Does not apply q33    Assessment: 82 year old male  with past medical history of 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   Home warfarin dose: warfarin 7.5 mg MWF, 3.75 mg TTSS  DDI:  clopidogrel (on at home)  Goal of Therapy:  INR 2-3 Monitor platelets by anticoagulation protocol: Yes   Plan:  ---warfarin 7.5 mg po x 1 (equals home dose) ---INR in am to guide therapy ---CBC at least once weekly per protocol  Dallie Piles 03/31/2022,1:11 PM

## 2022-03-31 NOTE — Discharge Summary (Addendum)
[facility]    Discharge Summary    Patient ID:  Daniel Owen MRN: [id number] DOB/AGE: March 05, 1940 82 y.o.  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  It was noted that he had an 85% proximal right ICA stenosis with he is also present prior to intervention.  Following his intervention today he has not had any significant issues.  Initially had some hypotension but this has been resolved overnight.  Since that time he has not had any difficulties with recovery.  Hospital Course:  PAPE YAHR is a 82 y.o. male is S/P Right Carotid Stent  Extubated: POD # 0 Physical Exam:  Alert notes x3, no acute distress Face: Symmetrical.  Tongue is midline. Neck: Trachea is midline.  No swelling or bruising. Cardiovascular: Regular rate and rhythm Pulmonary: Clear to auscultation bilaterally Abdomen: Soft, nontender, nondistended Right groin access: Clean dry and intact.  No swelling or drainage noted Left groin access: Clean dry and intact.  No swelling or drainage noted Left lower extremity: Thigh soft.  Calf soft.  Extremities warm distally toes.  Hard to palpate pedal pulses however the foot is warm is her good capillary refill. Right lower extremity: Thigh soft.  Calf soft.  Extremities warm distally  toes.  Hard to palpate pedal pulses however the foot is warm is her good capillary refill. Neurological: No deficits noted   Post-op wounds:  clean, dry, intact or healing well  Pt. Ambulating, voiding and taking PO diet without difficulty. Pt pain controlled with PO pain meds.  Labs:  As below  Complications: none  Consults:    Significant Diagnostic Studies: CBC Lab Results  Component Value Date   WBC 8.1 03/31/2022   HGB 10.8 (L) 03/31/2022   HCT 33.2 (L) 03/31/2022   MCV 93.3 03/31/2022   PLT 192 03/31/2022    BMET    Component Value Date/Time   NA 142 03/31/2022 0723   K 3.9 03/31/2022 0723   CL 105 03/31/2022 0723   CO2 25 03/31/2022 0723   GLUCOSE 151 (H) 03/31/2022 0723   BUN 16 03/31/2022 0723   CREATININE 0.65 03/31/2022 0723   CALCIUM 8.4 (L) 03/31/2022 0723   GFRNONAA >60 03/31/2022 0723   COAG Lab Results  Component Value Date   INR 2.2 (H) 03/31/2022   INR 2.9 (H) 03/30/2022   INR 3.1 (H) 08/28/2021     Disposition:  Discharge to :Home Discharge Instructions     CAROTID Sugery: Call MD for difficulty swallowing or speaking; weakness in arms or legs that is a new symtom; severe headache.  If you have increased swelling in the neck and/or  are having difficulty breathing, CALL 911   Complete by: As directed    Call MD for:  redness, tenderness, or signs of infection (pain, swelling, bleeding, redness, odor or green/yellow discharge around  incision site)   Complete by: As directed    Call MD for:  severe or increased pain, loss or decreased feeling  in affected limb(s)   Complete by: As directed    Call MD for:  temperature >100.5   Complete by: As directed    Driving Restrictions   Complete by: As directed    No driving while experiencing dizziness   Lifting restrictions   Complete by: As directed    No lifting for 1 week   Remove dressing in 24 hours   Complete by: As directed    May shower at that time.  Can place Band-Aid over  wound   Resume previous diet   Complete by: As directed       Allergies as of 03/31/2022       Reactions   Aspirin    Other reaction(s): Other (See Comments), Other (See Comments) Pt is unable to have aspirin products due to the adverse reaction to warfarin. Pt is unable to have aspirin products due to the adverse reaction to warfarin. Pt is unable to have aspirin products due to the adverse reaction to warfarin.   Ibuprofen    Other reaction(s): Other (See Comments), Other (See Comments) Affects clotting factors Affects clotting factors Affects clotting factors        Medication List     TAKE these medications    acetaminophen 500 MG tablet Commonly known as: TYLENOL Take 1,000 mg by mouth every 6 (six) hours as needed for mild pain or fever.   allopurinol 300 MG tablet Commonly known as: ZYLOPRIM Take 300 mg by mouth daily. Notes to patient: Resume home routine   atorvastatin 40 MG tablet Commonly known as: LIPITOR Take 40 mg by mouth daily. Notes to patient: Next dose due Sat 3/23   calcium citrate 950 (200 Ca) MG tablet Commonly known as: CALCITRATE - dosed in mg elemental calcium Take 2 tablets by mouth 2 (two) times daily. Notes to patient: Next dose due Friday 3/22 in evening   clopidogrel 75 MG tablet Commonly known as: PLAVIX Take 1 tablet (75 mg total) by mouth daily at 6 (six) AM. Start taking on: April 01, 2022 Notes to patient: Next dose due Sat 3/23   losartan 25 MG tablet Commonly known as: COZAAR Take 25 mg by mouth daily. Notes to patient: Next dose due Sat 2/23   meclizine 25 MG tablet Commonly known as: ANTIVERT Take 25 mg by mouth 3 (three) times daily as needed for dizziness.   metFORMIN 500 MG tablet Commonly known as: GLUCOPHAGE Take 500 mg by mouth 2 (two) times daily. Notes to patient: Resume home routine, starting 3/23   oxyCODONE-acetaminophen 5-325 MG tablet Commonly known as: PERCOCET/ROXICET Take 1-2 tablets by mouth  every 4 (four) hours as needed for moderate pain.   potassium citrate 10 MEQ (1080 MG) SR tablet Commonly known as: UROCIT-K Take 20 mEq by mouth 2 (two) times daily. Notes to patient: Next dose due Fri 3/22 in evening   pregabalin 75 MG capsule Commonly known as: LYRICA Take 75 mg by mouth 2 (two) times daily. Notes to patient: Next dose due Fri 3/22 in evening   tamsulosin 0.4 MG Caps capsule Commonly known as: FLOMAX Take 0.4 mg by mouth daily. Notes to patient: Next dose due Sat 3/23   warfarin 7.5 MG tablet Commonly known as: COUMADIN Take 3.75-7.5 mg by mouth daily. Monday, Wednesday and Friday takes 7.5 mg. Tuesday, Thursday, Saturday, Sunday 3.75 mg Notes  to patient: Resume home routine on Sat 3/23       Verbal and written Discharge instructions given to the patient. Wound care per Discharge AVS  Follow-up Information     Dew, Erskine Squibb, MD Follow up.   Specialties: Vascular Surgery, Radiology, Interventional Cardiology Why: 3 weeks with carotid duplex, see JD/ FB Contact information: [address] [place] [address] [phone]                 Signed: Kris Hartmann, NP  03/31/2022, 4:09 PM

## 2022-04-01 LAB — HEMOGLOBIN A1C
Hgb A1c MFr Bld: 7.5 % — ABNORMAL HIGH (ref 4.8–5.6)
Mean Plasma Glucose: 169 mg/dL

## 2022-04-11 ENCOUNTER — Telehealth (INDEPENDENT_AMBULATORY_CARE_PROVIDER_SITE_OTHER): Payer: Self-pay

## 2022-04-11 NOTE — Telephone Encounter (Signed)
Patient was scheduled for a carotid stent placement with Dr. Lucky Cowboy on 03/30/22 at the [facility].

## 2022-04-11 NOTE — Telephone Encounter (Signed)
Daniel Owen had  a stent put in on 03/30/22 his wife said he has a large bruise between his knee and groin area, and was wonder should they be worried? They have been using a heating pad, which has been bringing him comfort.   Per Dr. Lucky Cowboy- It's normal and it will go away and the heating pad will be fine to use.

## 2022-05-02 ENCOUNTER — Other Ambulatory Visit (INDEPENDENT_AMBULATORY_CARE_PROVIDER_SITE_OTHER): Payer: Self-pay | Admitting: Vascular Surgery

## 2022-05-02 ENCOUNTER — Ambulatory Visit (INDEPENDENT_AMBULATORY_CARE_PROVIDER_SITE_OTHER): Payer: Medicare Other | Admitting: Vascular Surgery

## 2022-05-02 ENCOUNTER — Encounter (INDEPENDENT_AMBULATORY_CARE_PROVIDER_SITE_OTHER): Payer: Self-pay | Admitting: Vascular Surgery

## 2022-05-02 ENCOUNTER — Ambulatory Visit (INDEPENDENT_AMBULATORY_CARE_PROVIDER_SITE_OTHER): Payer: Medicare Other

## 2022-05-02 VITALS — BP 113/62 | HR 83 | Resp 18 | Ht 72.0 in | Wt 205.0 lb

## 2022-05-02 DIAGNOSIS — I6523 Occlusion and stenosis of bilateral carotid arteries: Secondary | ICD-10-CM

## 2022-05-02 DIAGNOSIS — I6521 Occlusion and stenosis of right carotid artery: Secondary | ICD-10-CM

## 2022-05-02 DIAGNOSIS — E1159 Type 2 diabetes mellitus with other circulatory complications: Secondary | ICD-10-CM

## 2022-05-02 DIAGNOSIS — I10 Essential (primary) hypertension: Secondary | ICD-10-CM

## 2022-05-02 NOTE — Progress Notes (Signed)
Patient ID: Daniel Owen, male   DOB: [birth year], 82 y.o.   MRN: [id number]  No chief complaint on file.   HPI Daniel Owen is a 82 y.o. male.  ***   Past Medical History:  Diagnosis Date   Colon cancer (HCC)    2013   Diabetes mellitus without complication (HCC)    Hypertension    Pulmonary embolism (HCC)    14 to 15 years ago    Past Surgical History:  Procedure Laterality Date   CAROTID PTA/STENT INTERVENTION Right 03/30/2022   Procedure: CAROTID PTA/STENT INTERVENTION;  Surgeon: Annice Needy, MD;  Location: [facility] INVASIVE CV LAB;  Service: Cardiovascular;  Laterality: Right;   CHOLECYSTECTOMY     FOOT SURGERY Right    HEMORRHOID SURGERY     KNEE ARTHROSCOPY Right    LITHOTRIPSY     PARATHYROIDECTOMY     TOE AMPUTATION Right    3 toes   TONSILLECTOMY        Allergies  Allergen Reactions   Aspirin     Other reaction(s): Other (See Comments), Other (See Comments) Pt is unable to have aspirin products due to the adverse reaction to warfarin. Pt is unable to have aspirin products due to the adverse reaction to warfarin. Pt is unable to have aspirin products due to the adverse reaction to warfarin.    Ibuprofen     Other reaction(s): Other (See Comments), Other (See Comments) Affects clotting factors Affects clotting factors Affects clotting factors     Current Outpatient Medications  Medication Sig Dispense Refill   acetaminophen (TYLENOL) 500 MG tablet Take 1,000 mg by mouth every 6 (six) hours as needed for mild pain or fever.     allopurinol (ZYLOPRIM) 300 MG tablet Take 300 mg by mouth daily.     atorvastatin (LIPITOR) 40 MG tablet Take 40 mg by mouth daily.     calcium citrate (CALCITRATE - DOSED IN MG ELEMENTAL CALCIUM) 950 (200 Ca) MG tablet Take 2 tablets by mouth 2 (two) times daily.     clopidogrel (PLAVIX) 75 MG tablet Take 1 tablet (75 mg total) by mouth daily at 6 (six) AM. 30 tablet 3   losartan (COZAAR) 25 MG tablet Take 25 mg by mouth  daily.     meclizine (ANTIVERT) 25 MG tablet Take 25 mg by mouth 3 (three) times daily as needed for dizziness.     metFORMIN (GLUCOPHAGE) 500 MG tablet Take 500 mg by mouth 2 (two) times daily.     oxyCODONE-acetaminophen (PERCOCET/ROXICET) 5-325 MG tablet Take 1-2 tablets by mouth every 4 (four) hours as needed for moderate pain. 30 tablet 0   potassium citrate (UROCIT-K) 10 MEQ (1080 MG) SR tablet Take 20 mEq by mouth 2 (two) times daily.     pregabalin (LYRICA) 75 MG capsule Take 75 mg by mouth 2 (two) times daily.     tamsulosin (FLOMAX) 0.4 MG CAPS capsule Take 0.4 mg by mouth daily.     warfarin (COUMADIN) 7.5 MG tablet Take 3.75-7.5 mg by mouth daily. Monday, Wednesday and Friday takes 7.5 mg. Tuesday, Thursday, Saturday, Sunday 3.75 mg     No current facility-administered medications for this visit.        Physical Exam There were no vitals taken for this visit. Gen:  WD/WN, NAD Skin: incision C/D/I     Assessment/Plan:  No problem-specific Assessment & Plan notes found for this encounter.      Daniel Owen 05/02/2022, 2:25 PM  This note was created with Dragon medical transcription system.  Any errors from dictation are unintentional.

## 2022-05-03 NOTE — Assessment & Plan Note (Signed)
blood glucose control important in reducing the progression of atherosclerotic disease. Also, involved in wound healing. On appropriate medications.  

## 2022-05-03 NOTE — Assessment & Plan Note (Signed)
blood pressure control important in reducing the progression of atherosclerotic disease. On appropriate oral medications.  

## 2022-05-03 NOTE — Assessment & Plan Note (Signed)
Duplex today shows patent right carotid stent and 1 to 39% left ICA stenosis by duplex criteria.  Continue dual antiplatelet therapy and statin agent.  Hypertensive regimen as per his primary care physician.  Return in 3 months with carotid duplex.

## 2022-05-25 ENCOUNTER — Encounter: Payer: Self-pay | Admitting: Gastroenterology

## 2022-05-25 ENCOUNTER — Ambulatory Visit: Payer: Medicare Other | Admitting: Gastroenterology

## 2022-05-25 VITALS — BP 111/78 | HR 102 | Temp 97.7°F | Ht 72.0 in | Wt 213.0 lb

## 2022-05-25 DIAGNOSIS — Z85038 Personal history of other malignant neoplasm of large intestine: Secondary | ICD-10-CM | POA: Diagnosis not present

## 2022-05-25 NOTE — Progress Notes (Signed)
Gastroenterology Consultation  Referring Provider:     Jaclyn Shaggy, MD Primary Care Physician:  Jaclyn Shaggy, MD Primary Gastroenterologist:  Dr. Servando Snare     Reason for Consultation:     History of colon cancer        HPI:   Daniel Owen is a 82 y.o. y/o male referred for consultation & management of history of colon cancer by Dr. Jaclyn Shaggy, MD.  This patient comes in today with a history of having his last colonoscopy in 2018.  The patient had 8 inches of his colon removed in 2013 but is not sure which part of his colon was removed.  The patient has been following up at [facility] but due to his advanced age he decided to move his doctors to closer to his residence.  The patient denies any abdominal pain nausea vomiting fevers chills black stools or bloody stools.  He does report that he does suffer from frequent bouts of diarrhea.   Past Medical History:  Diagnosis Date   Colon cancer (HCC)    2013   Diabetes mellitus without complication (HCC)    Hypertension    Pulmonary embolism ([facility])    14 to 15 years ago    Past Surgical History:  Procedure Laterality Date   CAROTID PTA/STENT INTERVENTION Right 03/30/2022   Procedure: CAROTID PTA/STENT INTERVENTION;  Surgeon: Annice Needy, MD;  Location: [facility] INVASIVE CV LAB;  Service: Cardiovascular;  Laterality: Right;   CHOLECYSTECTOMY     FOOT SURGERY Right    HEMORRHOID SURGERY     KNEE ARTHROSCOPY Right    LITHOTRIPSY     PARATHYROIDECTOMY     TOE AMPUTATION Right    3 toes   TONSILLECTOMY      Prior to Admission medications   Medication Sig Start Date End Date Taking? Authorizing Provider  acetaminophen (TYLENOL) 500 MG tablet Take 1,000 mg by mouth every 6 (six) hours as needed for mild pain or fever.   Yes [provider]  allopurinol (ZYLOPRIM) 300 MG tablet Take 300 mg by mouth daily. 08/10/21  Yes [provider]  atorvastatin (LIPITOR) 40 MG tablet Take 40 mg by mouth daily. 06/14/21  Yes [provider]  calcium citrate (CALCITRATE - DOSED IN MG ELEMENTAL CALCIUM) 950 (200 Ca) MG tablet Take 2 tablets by mouth 2 (two) times daily.   Yes [provider]  clopidogrel (PLAVIX) 75 MG tablet Take 1 tablet (75 mg total) by mouth daily at 6 (six) AM. 04/01/22  Yes Georgiana Spinner, NP  losartan (COZAAR) 25 MG tablet Take 25 mg by mouth daily. 06/25/21  Yes [provider]  meclizine (ANTIVERT) 25 MG tablet Take 25 mg by mouth 3 (three) times daily as needed for dizziness.   Yes [provider]  metFORMIN (GLUCOPHAGE) 500 MG tablet Take 500 mg by mouth 2 (two) times daily. 07/14/21  Yes [provider]  potassium citrate (UROCIT-K) 10 MEQ (1080 MG) SR tablet Take 20 mEq by mouth 2 (two) times daily. 08/24/21  Yes [provider]  pregabalin (LYRICA) 75 MG capsule Take 75 mg by mouth 2 (two) times daily. 08/20/21  Yes [provider]  tamsulosin (FLOMAX) 0.4 MG CAPS capsule Take 0.4 mg by mouth daily. 08/10/21  Yes [provider]  warfarin (COUMADIN) 7.5 MG tablet Take 3.75-7.5 mg by mouth daily. Monday, Wednesday and Friday takes 7.5 mg. Tuesday, Thursday, Saturday, Sunday 3.75 mg 06/29/21  Yes [provider]    Family History  Problem Relation Age of Onset   Arthritis/Rheumatoid Mother    Heart failure Mother    Dementia Father      Social History   Tobacco Use   Smoking status: Former    Packs/day: 1.00    Years: 10.00    Additional pack years: 0.00    Total pack years: 10.00    Types: Cigarettes, Cigars    Quit date: 05/25/2021    Years since quitting: 1.0   Smokeless tobacco: Current    Types: Chew  Vaping Use   Vaping Use: Never used  Substance Use Topics   Alcohol use: Yes    Comment: RARE   Drug use: Never    Allergies as of 05/25/2022 - Review Complete 05/25/2022  Allergen Reaction Noted   Aspirin  06/30/2013   Ibuprofen  01/29/2013    Review of Systems:    All systems reviewed and negative  except where noted in HPI.   Physical Exam:  BP 111/78 (BP Location: Left Arm, Patient Position: Sitting, Cuff Size: Normal)   Pulse (!) 102   Temp 97.7 F (36.5 C) (Oral)   Ht 6' (1.829 m)   Wt 213 lb (96.6 kg)   BMI 28.89 kg/m  No LMP for male patient. General:   Alert,  Well-developed, well-nourished, pleasant and cooperative in NAD Head:  Normocephalic and atraumatic. Eyes:  Sclera clear, no icterus.   Conjunctiva pink. Ears:  Normal auditory acuity. Neck:  Supple; no masses or thyromegaly. Lungs:  Respirations even and unlabored.  Clear throughout to auscultation.   No wheezes, crackles, or rhonchi. No acute distress. Heart:  Regular rate and rhythm; no murmurs, clicks, rubs, or gallops. Abdomen:  Normal bowel sounds.  No bruits.  Soft, non-tender and non-distended without masses, hepatosplenomegaly or hernias noted.  No guarding or rebound tenderness.  Negative Carnett sign.   Rectal:  Deferred.  Pulses:  Normal pulses noted. Extremities:  No clubbing or edema.  No cyanosis. Neurologic:  Alert and oriented x3;  grossly normal neurologically. Skin:  Intact without significant lesions or rashes.  No jaundice. Lymph Nodes:  No significant cervical adenopathy. Psych:  Alert and cooperative. Normal mood and affect.  Imaging Studies: VAS US CAROTID  Result Date: 05/04/2022 Carotid Arterial Duplex Study Patient Name:  Daniel Owen  Date of Exam:   05/02/2022 Medical Rec #: [id number]       Accession #:    [id number] Date of Birth: [birth year]       Patient Gender: M Patient Age:   16 years Exam Location:  [facility] Procedure:      VAS US CAROTID Referring Phys: [name] --------------------------------------------------------------------------------  Indications:   Carotid artery disease and Right stent. Other Factors: Ulcerative. Performing Technologist: Salvadore Farber RVT  Examination Guidelines: A complete evaluation includes B-mode imaging, spectral Doppler, color  Doppler, and power Doppler as needed of all accessible portions of each vessel. Bilateral testing is considered an integral part of a complete examination. Limited examinations for reoccurring indications may be performed as noted.  Right Carotid Findings: +----------+--------+--------+--------+-------------------+--------+           PSV cm/sEDV cm/sStenosisPlaque Description Comments +----------+--------+--------+--------+-------------------+--------+ CCA Prox  61      14                                          +----------+--------+--------+--------+-------------------+--------+  CCA Mid   55      11                                          +----------+--------+--------+--------+-------------------+--------+ CCA Distal53      9                                  stent    +----------+--------+--------+--------+-------------------+--------+ ICA Prox  63      16              calcific and smoothstent    +----------+--------+--------+--------+-------------------+--------+ ICA Mid   64      22                                          +----------+--------+--------+--------+-------------------+--------+ ICA Distal63      20                                          +----------+--------+--------+--------+-------------------+--------+ ECA       80      18                                          +----------+--------+--------+--------+-------------------+--------+ +----------+--------+-------+----------------+-------------------+           PSV cm/sEDV cmsDescribe        Arm Pressure (mmHG) +----------+--------+-------+----------------+-------------------+ [id number]             Multiphasic, WNL                    +----------+--------+-------+----------------+-------------------+ +---------+--------+--+--------+-+---------+ VertebralPSV cm/s34EDV cm/s4Antegrade +---------+--------+--+--------+-+---------+  Left Carotid Findings:  +----------+--------+--------+--------+------------------+--------+           PSV cm/sEDV cm/sStenosisPlaque DescriptionComments +----------+--------+--------+--------+------------------+--------+ CCA Prox  80      15                                         +----------+--------+--------+--------+------------------+--------+ CCA Mid   70      15                                         +----------+--------+--------+--------+------------------+--------+ CCA Distal60      9                                          +----------+--------+--------+--------+------------------+--------+ ICA Prox  44      16              calcific                   +----------+--------+--------+--------+------------------+--------+ ICA Mid   59      22                                         +----------+--------+--------+--------+------------------+--------+  ICA Distal50      15                                         +----------+--------+--------+--------+------------------+--------+ ECA       30      1                                          +----------+--------+--------+--------+------------------+--------+ +----------+--------+--------+----------------+-------------------+           PSV cm/sEDV cm/sDescribe        Arm Pressure (mmHG) +----------+--------+--------+----------------+-------------------+ [id number]              Multiphasic, WNL                    +----------+--------+--------+----------------+-------------------+ +---------+--------+--+--------+--+---------+ VertebralPSV cm/s29EDV cm/s10Antegrade +---------+--------+--+--------+--+---------+   Summary: Right Carotid: Velocities in the right ICA are consistent with a 1-39% stenosis.                Patent new CCA/ICA stent. High bifurcation/technically difficult                study. Ulcerative plaque seen with some residual flow within. Left Carotid: Velocities in the left ICA are consistent with a  1-39% stenosis.               Non-hemodynamically significant plaque <50% noted in the CCA. The               ECA appears <50% stenosed. High bifurcation. Vertebrals:  Bilateral vertebral arteries demonstrate antegrade flow. Subclavians: Normal flow hemodynamics were seen in bilateral subclavian              arteries. *See table(s) above for measurements and observations.  Electronically signed by [name] MD on 05/04/2022 at 9:52:20 AM.    Final     Assessment and Plan:   Daniel Owen is a 82 y.o. y/o male who comes in with a history of colon cancer in 2013 with his last colonoscopy in 2018.  The patient is in need of a repeat colonoscopy.  The patient is transferring care to me because his previous colonoscopies were done at [facility] and he would like to transfer his care to me.  The patient will be set up for colonoscopy at the next avail appointment.  The patient does have a history of vascular issues and may need some clearance prior to doing any procedures on him.  The patient and his wife have been explained the plan and agree with it.    Midge Minium, MD. Clementeen Graham    Note: This dictation was prepared with Dragon dictation along with smaller phrase technology. Any transcriptional errors that result from this process are unintentional.

## 2022-07-27 ENCOUNTER — Other Ambulatory Visit (INDEPENDENT_AMBULATORY_CARE_PROVIDER_SITE_OTHER): Payer: Self-pay | Admitting: Nurse Practitioner

## 2022-08-01 ENCOUNTER — Encounter (INDEPENDENT_AMBULATORY_CARE_PROVIDER_SITE_OTHER): Payer: Self-pay | Admitting: Vascular Surgery

## 2022-08-01 ENCOUNTER — Ambulatory Visit (INDEPENDENT_AMBULATORY_CARE_PROVIDER_SITE_OTHER): Payer: Medicare Other

## 2022-08-01 ENCOUNTER — Ambulatory Visit (INDEPENDENT_AMBULATORY_CARE_PROVIDER_SITE_OTHER): Payer: Medicare Other | Admitting: Vascular Surgery

## 2022-08-01 VITALS — BP 157/86 | HR 71 | Resp 16 | Wt 215.4 lb

## 2022-08-01 DIAGNOSIS — D682 Hereditary deficiency of other clotting factors: Secondary | ICD-10-CM | POA: Diagnosis not present

## 2022-08-01 DIAGNOSIS — I6523 Occlusion and stenosis of bilateral carotid arteries: Secondary | ICD-10-CM | POA: Diagnosis not present

## 2022-08-01 DIAGNOSIS — E1159 Type 2 diabetes mellitus with other circulatory complications: Secondary | ICD-10-CM

## 2022-08-01 DIAGNOSIS — I10 Essential (primary) hypertension: Secondary | ICD-10-CM

## 2022-08-01 DIAGNOSIS — I6521 Occlusion and stenosis of right carotid artery: Secondary | ICD-10-CM | POA: Diagnosis not present

## 2022-08-01 NOTE — Progress Notes (Signed)
MRN : [id number]  Daniel Owen is a 82 y.o. (1940-08-01) male who presents with chief complaint of  Chief Complaint  Patient presents with   Follow-up    Ultrasound follow up  .  History of Present Illness: Patient returns in follow-up of his carotid disease.  He is about 4 months status post right carotid stent placement for high-grade stenosis with severe ulceration and severe dizziness.  His dizziness has gotten much better.  He has had no focal neurologic symptoms such as arm or leg weakness or numbness, speech or swallowing difficulty, or temporary monocular blindness.  His duplex today shows a widely patent right carotid stent with no significant left carotid artery stenosis.  Current Outpatient Medications  Medication Sig Dispense Refill   acetaminophen (TYLENOL) 500 MG tablet Take 1,000 mg by mouth every 6 (six) hours as needed for mild pain or fever.     allopurinol (ZYLOPRIM) 300 MG tablet Take 300 mg by mouth daily.     atorvastatin (LIPITOR) 40 MG tablet Take 40 mg by mouth daily.     calcium citrate (CALCITRATE - DOSED IN MG ELEMENTAL CALCIUM) 950 (200 Ca) MG tablet Take 2 tablets by mouth 2 (two) times daily.     clopidogrel (PLAVIX) 75 MG tablet TAKE 1 TABLET BY MOUTH ONCE DAILY AT 6 MORNING 30 tablet 3   colestipol (COLESTID) 1 g tablet Take 1 g by mouth daily.     losartan (COZAAR) 25 MG tablet Take 25 mg by mouth daily.     meclizine (ANTIVERT) 25 MG tablet Take 25 mg by mouth 3 (three) times daily as needed for dizziness.     metFORMIN (GLUCOPHAGE) 500 MG tablet Take 500 mg by mouth 2 (two) times daily.     potassium citrate (UROCIT-K) 10 MEQ (1080 MG) SR tablet Take 20 mEq by mouth 2 (two) times daily.     pregabalin (LYRICA) 75 MG capsule Take 75 mg by mouth 2 (two) times daily.     tamsulosin (FLOMAX) 0.4 MG CAPS capsule Take 0.4 mg by mouth daily.     warfarin (COUMADIN) 7.5 MG tablet Take 3.75-7.5 mg by mouth daily. Monday, Wednesday and Friday takes 7.5 mg.  Tuesday, Thursday, Saturday, Sunday 3.75 mg     No current facility-administered medications for this visit.    Past Medical History:  Diagnosis Date   Colon cancer (HCC)    2013   Diabetes mellitus without complication (HCC)    Hypertension    Pulmonary embolism (HCC)    14 to 15 years ago    Past Surgical History:  Procedure Laterality Date   CAROTID PTA/STENT INTERVENTION Right 03/30/2022   Procedure: CAROTID PTA/STENT INTERVENTION;  Surgeon: Annice Needy, MD;  Location: [facility];  Service: Cardiovascular;  Laterality: Right;   CHOLECYSTECTOMY     FOOT SURGERY Right    HEMORRHOID SURGERY     KNEE ARTHROSCOPY Right    LITHOTRIPSY     PARATHYROIDECTOMY     TOE AMPUTATION Right    3 toes   TONSILLECTOMY       Social History   Tobacco Use   Smoking status: Former    Current packs/day: 0.00    Average packs/day: 1 pack/day for 10.0 years (10.0 ttl pk-yrs)    Types: Cigarettes, Cigars    Start date: 02/27/1963    Quit date: 05/25/2021    Years since quitting: 1.1   Smokeless tobacco: Current    Types: Chew  Vaping  Use   Vaping status: Never Used  Substance Use Topics   Alcohol use: Yes    Comment: RARE   Drug use: Never      Family History  Problem Relation Age of Onset   Arthritis/Rheumatoid Mother    Heart failure Mother    Dementia Father     Allergies  Allergen Reactions   Aspirin     Other reaction(s): Other (See Comments), Other (See Comments) Pt is unable to have aspirin products due to the adverse reaction to warfarin. Pt is unable to have aspirin products due to the adverse reaction to warfarin. Pt is unable to have aspirin products due to the adverse reaction to warfarin.    Ibuprofen     Other reaction(s): Other (See Comments), Other (See Comments) Affects clotting factors Affects clotting factors Affects clotting factors      REVIEW OF SYSTEMS (Negative unless checked)   Constitutional: [] Weight loss  [] Fever   [] Chills Cardiac: [] Chest pain   [] Chest pressure   [] Palpitations   [] Shortness of breath when laying flat   [] Shortness of breath at rest   [] Shortness of breath with exertion. Vascular:  [] Pain in legs with walking   [] Pain in legs at rest   [] Pain in legs when laying flat   [] Claudication   [] Pain in feet when walking  [] Pain in feet at rest  [] Pain in feet when laying flat   [x] History of DVT   [] Phlebitis   [x] Swelling in legs   [] Varicose veins   [] Non-healing ulcers Pulmonary:   [] Uses home oxygen   [] Productive cough   [] Hemoptysis   [] Wheeze  [] COPD   [] Asthma Neurologic:  [x] Dizziness  [] Blackouts   [] Seizures   [] History of stroke   [] History of TIA  [] Aphasia   [] Temporary blindness   [] Dysphagia   [] Weakness or numbness in arms   [] Weakness or numbness in legs Musculoskeletal:  [x] Arthritis   [] Joint swelling   [x] Joint pain   [] Low back pain Hematologic:  [] Easy bruising  [] Easy bleeding   [] Hypercoagulable state   [] Anemic  [] Hepatitis Gastrointestinal:  [] Blood in stool   [] Vomiting blood  [] Gastroesophageal reflux/heartburn   [] Abdominal pain Genitourinary:  [] Chronic kidney disease   [] Difficult urination  [] Frequent urination  [] Burning with urination   [] Hematuria Skin:  [] Rashes   [] Ulcers   [] Wounds Psychological:  [] History of anxiety   []  History of major depression.  Physical Examination  Vitals:   08/01/22 1510  BP: (!) 157/86  Pulse: 71  Resp: 16  Weight: 215 lb 6.4 oz (97.7 kg)   Body mass index is 29.21 kg/m. Gen:  WD/WN, NAD. Appears younger than stated age. Head: [place]/AT, No temporalis wasting. Ear/Nose/Throat: Hearing grossly intact, nares w/o erythema or drainage, trachea midline Eyes: Conjunctiva clear. Sclera non-icteric Neck: Supple.  No bruit  Pulmonary:  Good air movement, equal and clear to auscultation bilaterally.  Cardiac: RRR, No JVD Vascular:  Vessel Right Left  Radial Palpable Palpable           Musculoskeletal: M/S 5/5 throughout.  No  deformity or atrophy. Trace LE edema. Neurologic: CN 2-12 intact. Sensation grossly intact in extremities.  Symmetrical.  Speech is fluent. Motor exam as listed above. Psychiatric: Judgment intact, Mood & affect appropriate for pt's clinical situation. Dermatologic: No rashes or ulcers noted.  No cellulitis or open wounds.    CBC Lab Results  Component Value Date   WBC 8.1 03/31/2022   HGB 10.8 (L) 03/31/2022   HCT 33.2 (  L) 03/31/2022   MCV 93.3 03/31/2022   PLT 192 03/31/2022    BMET    Component Value Date/Time   NA 142 03/31/2022 0723   K 3.9 03/31/2022 0723   CL 105 03/31/2022 0723   CO2 25 03/31/2022 0723   GLUCOSE 151 (H) 03/31/2022 0723   BUN 16 03/31/2022 0723   CREATININE 0.65 03/31/2022 0723   CALCIUM 8.4 (L) 03/31/2022 0723   GFRNONAA >60 03/31/2022 0723   CrCl cannot be calculated (Patient's most recent lab result is older than the maximum 21 days allowed.).  COAG Lab Results  Component Value Date   INR 2.2 (H) 03/31/2022   INR 2.9 (H) 03/30/2022   INR 3.1 (H) 08/28/2021    Radiology No results found.   Assessment/Plan Carotid stenosis His duplex today shows a widely patent right carotid stent with no significant left carotid artery stenosis.  Continue current medical regimen.  Recheck in 3 months with duplex.  Hypertension blood pressure control important in reducing the progression of atherosclerotic disease. On appropriate oral medications.     Type 2 diabetes mellitus with circulatory disorder, without long-term current use of insulin (HCC) blood glucose control important in reducing the progression of atherosclerotic disease. Also, involved in wound healing. On appropriate medications.     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    [name], MD  08/01/2022 4:23 PM    This note was created with Dragon medical transcription system.  Any errors from dictation are purely unintentional

## 2022-08-01 NOTE — Assessment & Plan Note (Signed)
His duplex today shows a widely patent right carotid stent with no significant left carotid artery stenosis.  Continue current medical regimen.  Recheck in 3 months with duplex.

## 2022-08-26 LAB — LAB REPORT - SCANNED
A1c: 8
EGFR: 87

## 2022-11-07 ENCOUNTER — Ambulatory Visit (INDEPENDENT_AMBULATORY_CARE_PROVIDER_SITE_OTHER): Payer: Medicare Other | Admitting: Vascular Surgery

## 2022-11-07 ENCOUNTER — Encounter (INDEPENDENT_AMBULATORY_CARE_PROVIDER_SITE_OTHER): Payer: Self-pay | Admitting: Vascular Surgery

## 2022-11-07 ENCOUNTER — Ambulatory Visit (INDEPENDENT_AMBULATORY_CARE_PROVIDER_SITE_OTHER): Payer: Medicare Other

## 2022-11-07 VITALS — BP 152/80 | HR 63 | Resp 16 | Wt 216.6 lb

## 2022-11-07 DIAGNOSIS — E1159 Type 2 diabetes mellitus with other circulatory complications: Secondary | ICD-10-CM

## 2022-11-07 DIAGNOSIS — I10 Essential (primary) hypertension: Secondary | ICD-10-CM | POA: Diagnosis not present

## 2022-11-07 DIAGNOSIS — D682 Hereditary deficiency of other clotting factors: Secondary | ICD-10-CM

## 2022-11-07 DIAGNOSIS — I6523 Occlusion and stenosis of bilateral carotid arteries: Secondary | ICD-10-CM

## 2022-11-07 NOTE — Assessment & Plan Note (Signed)
His duplex today shows a widely patent right carotid stent and stable minimal left ICA stenosis.  He is on anticoagulation for factor V Leiden after doing 4 months of Plavix.  He will continue his statin agent.  Follow-up in 6 months with carotid duplex.

## 2022-11-07 NOTE — Progress Notes (Signed)
MRN : [id number]  Daniel Owen is a 82 y.o. (1940-08-29) male who presents with chief complaint of  Chief Complaint  Patient presents with   Follow-up    3 month ultrasound follow up  .  History of Present Illness: Patient returns in follow-up of his carotid disease.  He is about 7 months status post right carotid stent placement for high-grade stenosis.  His dizziness has markedly improved after the procedure.  He has had no focal neurologic symptoms since the procedure or his last visit.  He denies arm or leg weakness or numbness, speech or swallowing difficulty, or temporary monocular blindness.  His duplex today shows a widely patent right carotid stent and stable minimal left ICA stenosis.  Current Outpatient Medications  Medication Sig Dispense Refill   acetaminophen (TYLENOL) 500 MG tablet Take 1,000 mg by mouth every 6 (six) hours as needed for mild pain or fever.     allopurinol (ZYLOPRIM) 300 MG tablet Take 300 mg by mouth daily.     atorvastatin (LIPITOR) 40 MG tablet Take 40 mg by mouth daily.     calcium citrate (CALCITRATE - DOSED IN MG ELEMENTAL CALCIUM) 950 (200 Ca) MG tablet Take 2 tablets by mouth 2 (two) times daily.     colestipol (COLESTID) 1 g tablet Take 1 g by mouth daily.     losartan (COZAAR) 25 MG tablet Take 25 mg by mouth daily.     meclizine (ANTIVERT) 25 MG tablet Take 25 mg by mouth 3 (three) times daily as needed for dizziness.     metFORMIN (GLUCOPHAGE) 500 MG tablet Take 500 mg by mouth 2 (two) times daily.     potassium citrate (UROCIT-K) 10 MEQ (1080 MG) SR tablet Take 20 mEq by mouth 2 (two) times daily.     pregabalin (LYRICA) 75 MG capsule Take 75 mg by mouth 2 (two) times daily.     tamsulosin (FLOMAX) 0.4 MG CAPS capsule Take 0.4 mg by mouth daily.     warfarin (COUMADIN) 7.5 MG tablet Take 3.75-7.5 mg by mouth daily. Monday, Wednesday and Friday takes 7.5 mg. Tuesday, Thursday, Saturday, Sunday 3.75 mg     clopidogrel (PLAVIX) 75 MG tablet  TAKE 1 TABLET BY MOUTH ONCE DAILY AT 6 MORNING (Patient not taking: Reported on 11/07/2022) 30 tablet 3   No current facility-administered medications for this visit.    Past Medical History:  Diagnosis Date   Colon cancer (HCC)    2013   Diabetes mellitus without complication (HCC)    Hypertension    Pulmonary embolism (HCC)    14 to 15 years ago    Past Surgical History:  Procedure Laterality Date   CAROTID PTA/STENT INTERVENTION Right 03/30/2022   Procedure: CAROTID PTA/STENT INTERVENTION;  Surgeon: Annice Needy, MD;  Location: [facility];  Service: Cardiovascular;  Laterality: Right;   CHOLECYSTECTOMY     FOOT SURGERY Right    HEMORRHOID SURGERY     KNEE ARTHROSCOPY Right    LITHOTRIPSY     PARATHYROIDECTOMY     TOE AMPUTATION Right    3 toes   TONSILLECTOMY       Social History   Tobacco Use   Smoking status: Former    Current packs/day: 0.00    Average packs/day: 1 pack/day for 10.0 years (10.0 ttl pk-yrs)    Types: Cigarettes, Cigars    Start date: 02/27/1963    Quit date: 05/25/2021    Years since quitting: 1.4  Smokeless tobacco: Current    Types: Chew  Vaping Use   Vaping status: Never Used  Substance Use Topics   Alcohol use: Yes    Comment: RARE   Drug use: Never      Family History  Problem Relation Age of Onset   Arthritis/Rheumatoid Mother    Heart failure Mother    Dementia Father      Allergies  Allergen Reactions   Aspirin     Other reaction(s): Other (See Comments), Other (See Comments) Pt is unable to have aspirin products due to the adverse reaction to warfarin. Pt is unable to have aspirin products due to the adverse reaction to warfarin. Pt is unable to have aspirin products due to the adverse reaction to warfarin.    Ibuprofen     Other reaction(s): Other (See Comments), Other (See Comments) Affects clotting factors Affects clotting factors Affects clotting factors      REVIEW OF SYSTEMS (Negative unless  checked)   Constitutional: [] Weight loss  [] Fever  [] Chills Cardiac: [] Chest pain   [] Chest pressure   [] Palpitations   [] Shortness of breath when laying flat   [] Shortness of breath at rest   [] Shortness of breath with exertion. Vascular:  [] Pain in legs with walking   [] Pain in legs at rest   [] Pain in legs when laying flat   [] Claudication   [] Pain in feet when walking  [] Pain in feet at rest  [] Pain in feet when laying flat   [x] History of DVT   [] Phlebitis   [x] Swelling in legs   [] Varicose veins   [] Non-healing ulcers Pulmonary:   [] Uses home oxygen   [] Productive cough   [] Hemoptysis   [] Wheeze  [] COPD   [] Asthma Neurologic:  [x] Dizziness  [] Blackouts   [] Seizures   [] History of stroke   [] History of TIA  [] Aphasia   [] Temporary blindness   [] Dysphagia   [] Weakness or numbness in arms   [] Weakness or numbness in legs Musculoskeletal:  [x] Arthritis   [] Joint swelling   [x] Joint pain   [] Low back pain Hematologic:  [] Easy bruising  [] Easy bleeding   [] Hypercoagulable state   [] Anemic  [] Hepatitis Gastrointestinal:  [] Blood in stool   [] Vomiting blood  [] Gastroesophageal reflux/heartburn   [] Abdominal pain Genitourinary:  [] Chronic kidney disease   [] Difficult urination  [] Frequent urination  [] Burning with urination   [] Hematuria Skin:  [] Rashes   [] Ulcers   [] Wounds Psychological:  [] History of anxiety   []  History of major depression.  Physical Examination  Vitals:   11/07/22 1143  BP: (!) 152/80  Pulse: 63  Resp: 16  Weight: 216 lb 9.6 oz (98.2 kg)   Body mass index is 29.38 kg/m. Gen:  WD/WN, NAD.  Appears younger than stated age Head: [place]/AT, No temporalis wasting. Ear/Nose/Throat: Hearing grossly intact, nares w/o erythema or drainage, trachea midline Eyes: Conjunctiva clear. Sclera non-icteric Neck: Supple.  No bruit  Pulmonary:  Good air movement, equal and clear to auscultation bilaterally.  Cardiac: RRR, No JVD Vascular:  Vessel Right Left  Radial Palpable Palpable            Musculoskeletal: M/S 5/5 throughout.  No deformity or atrophy.  No edema. Neurologic: CN 2-12 intact. Sensation grossly intact in extremities.  Symmetrical.  Speech is fluent. Motor exam as listed above. Psychiatric: Judgment intact, Mood & affect appropriate for pt's clinical situation. Dermatologic: No rashes or ulcers noted.  No cellulitis or open wounds. Lymph : No Cervical, Axillary, or Inguinal lymphadenopathy.    CBC Lab  Results  Component Value Date   WBC 8.1 03/31/2022   HGB 10.8 (L) 03/31/2022   HCT 33.2 (L) 03/31/2022   MCV 93.3 03/31/2022   PLT 192 03/31/2022    BMET    Component Value Date/Time   NA 142 03/31/2022 0723   K 3.9 03/31/2022 0723   CL 105 03/31/2022 0723   CO2 25 03/31/2022 0723   GLUCOSE 151 (H) 03/31/2022 0723   BUN 16 03/31/2022 0723   CREATININE 0.65 03/31/2022 0723   CALCIUM 8.4 (L) 03/31/2022 0723   GFRNONAA >60 03/31/2022 0723   CrCl cannot be calculated (Patient's most recent lab result is older than the maximum 21 days allowed.).  COAG Lab Results  Component Value Date   INR 2.2 (H) 03/31/2022   INR 2.9 (H) 03/30/2022   INR 3.1 (H) 08/28/2021    Radiology No results found.   Assessment/Plan Carotid stenosis His duplex today shows a widely patent right carotid stent and stable minimal left ICA stenosis.  He is on anticoagulation for factor V Leiden after doing 4 months of Plavix.  He will continue his statin agent.  Follow-up in 6 months with carotid duplex.  Hypertension blood pressure control important in reducing the progression of atherosclerotic disease. On appropriate oral medications.     Type 2 diabetes mellitus with circulatory disorder, without long-term current use of insulin (HCC) blood glucose control important in reducing the progression of atherosclerotic disease. Also, involved in wound healing. On appropriate medications.     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    [name], MD  11/07/2022 12:19 PM    This note was created with Dragon medical transcription system.  Any errors from dictation are purely unintentional

## 2023-01-08 ENCOUNTER — Emergency Department: Payer: Medicare Other

## 2023-01-08 ENCOUNTER — Other Ambulatory Visit: Payer: Self-pay

## 2023-01-08 ENCOUNTER — Encounter: Payer: Self-pay | Admitting: Emergency Medicine

## 2023-01-08 ENCOUNTER — Emergency Department
Admission: EM | Admit: 2023-01-08 | Discharge: 2023-01-09 | Disposition: A | Discharge status: Home / Self Care | Payer: Medicare Other | Attending: Emergency Medicine | Admitting: Emergency Medicine

## 2023-01-08 DIAGNOSIS — I6789 Other cerebrovascular disease: Secondary | ICD-10-CM | POA: Insufficient documentation

## 2023-01-08 DIAGNOSIS — D72829 Elevated white blood cell count, unspecified: Secondary | ICD-10-CM | POA: Diagnosis not present

## 2023-01-08 DIAGNOSIS — R42 Dizziness and giddiness: Secondary | ICD-10-CM | POA: Insufficient documentation

## 2023-01-08 DIAGNOSIS — R531 Weakness: Secondary | ICD-10-CM | POA: Diagnosis present

## 2023-01-08 DIAGNOSIS — I119 Hypertensive heart disease without heart failure: Secondary | ICD-10-CM | POA: Diagnosis not present

## 2023-01-08 DIAGNOSIS — R319 Hematuria, unspecified: Secondary | ICD-10-CM

## 2023-01-08 DIAGNOSIS — I444 Left anterior fascicular block: Secondary | ICD-10-CM | POA: Insufficient documentation

## 2023-01-08 DIAGNOSIS — Z20822 Contact with and (suspected) exposure to covid-19: Secondary | ICD-10-CM | POA: Diagnosis not present

## 2023-01-08 DIAGNOSIS — E860 Dehydration: Secondary | ICD-10-CM

## 2023-01-08 DIAGNOSIS — E119 Type 2 diabetes mellitus without complications: Secondary | ICD-10-CM | POA: Insufficient documentation

## 2023-01-08 DIAGNOSIS — R000 Tachycardia, unspecified: Secondary | ICD-10-CM | POA: Insufficient documentation

## 2023-01-08 DIAGNOSIS — R079 Chest pain, unspecified: Secondary | ICD-10-CM | POA: Insufficient documentation

## 2023-01-08 DIAGNOSIS — I10 Essential (primary) hypertension: Secondary | ICD-10-CM | POA: Insufficient documentation

## 2023-01-08 LAB — CBC
HCT: 40.6 % (ref 39.0–52.0)
Hemoglobin: 13.4 g/dL (ref 13.0–17.0)
MCH: 29.6 pg (ref 26.0–34.0)
MCHC: 33 g/dL (ref 30.0–36.0)
MCV: 89.6 fL (ref 80.0–100.0)
Platelets: 277 10*3/uL (ref 150–400)
RBC: 4.53 MIL/uL (ref 4.22–5.81)
RDW: 14.2 % (ref 11.5–15.5)
WBC: 12.8 10*3/uL — ABNORMAL HIGH (ref 4.0–10.5)
nRBC: 0 % (ref 0.0–0.2)

## 2023-01-08 LAB — DIFFERENTIAL
Abs Immature Granulocytes: 0.07 10*3/uL (ref 0.00–0.07)
Basophils Absolute: 0.1 10*3/uL (ref 0.0–0.1)
Basophils Relative: 0 %
Eosinophils Absolute: 0.1 10*3/uL (ref 0.0–0.5)
Eosinophils Relative: 1 %
Immature Granulocytes: 1 %
Lymphocytes Relative: 9 %
Lymphs Abs: 1.1 10*3/uL (ref 0.7–4.0)
Monocytes Absolute: 1.3 10*3/uL — ABNORMAL HIGH (ref 0.1–1.0)
Monocytes Relative: 10 %
Neutro Abs: 10.1 10*3/uL — ABNORMAL HIGH (ref 1.7–7.7)
Neutrophils Relative %: 79 %

## 2023-01-08 LAB — COMPREHENSIVE METABOLIC PANEL
ALT: 23 U/L (ref 0–44)
AST: 26 U/L (ref 15–41)
Albumin: 4.3 g/dL (ref 3.5–5.0)
Alkaline Phosphatase: 106 U/L (ref 38–126)
Anion gap: 10 (ref 5–15)
BUN: 15 mg/dL (ref 8–23)
CO2: 22 mmol/L (ref 22–32)
Calcium: 9 mg/dL (ref 8.9–10.3)
Chloride: 102 mmol/L (ref 98–111)
Creatinine, Ser: 0.83 mg/dL (ref 0.61–1.24)
GFR, Estimated: >60 mL/min (ref >60)
Glucose, Bld: 170 mg/dL — ABNORMAL HIGH (ref 70–99)
Potassium: 4.2 mmol/L (ref 3.5–5.1)
Sodium: 134 mmol/L — ABNORMAL LOW (ref 135–145)
Total Bilirubin: 1.4 mg/dL — ABNORMAL HIGH (ref 0.0–1.2)
Total Protein: 7.6 g/dL (ref 6.5–8.1)

## 2023-01-08 LAB — CBG MONITORING, ED: Glucose-Capillary: 160 mg/dL — ABNORMAL HIGH (ref 70–99)

## 2023-01-08 LAB — PROTIME-INR
INR: 2.3 — ABNORMAL HIGH (ref 0.8–1.2)
Prothrombin Time: 25.6 s — ABNORMAL HIGH (ref 11.4–15.2)

## 2023-01-08 LAB — ETHANOL: Alcohol, Ethyl (B): <10 mg/dL (ref <10)

## 2023-01-08 LAB — APTT: aPTT: 61 s — ABNORMAL HIGH (ref 24–36)

## 2023-01-08 NOTE — ED Triage Notes (Signed)
Patient wheeled to triage brought in via EMS tonight with complaints of weakness, dizziness, and chest pain. States he has felt weak in the right leg specifically for at least 2 weeks, denies weakness in right arm. Tonight felt dizziness and difficulty ambulating.Does have hx of blood clots, on thinners.

## 2023-01-08 NOTE — ED Triage Notes (Signed)
EMS brings pt in from home for c/o weakness for several days; ambulatory at baseline with hx dementia

## 2023-01-09 LAB — RESP PANEL BY RT-PCR (RSV, FLU A&B, COVID)  RVPGX2
Influenza A by PCR: NEGATIVE
Influenza B by PCR: NEGATIVE
Resp Syncytial Virus by PCR: NEGATIVE
SARS Coronavirus 2 by RT PCR: NEGATIVE

## 2023-01-09 LAB — URINALYSIS, COMPLETE (UACMP) WITH MICROSCOPIC
Bilirubin Urine: NEGATIVE
Glucose, UA: 50 mg/dL — AB
Ketones, ur: NEGATIVE mg/dL
Leukocytes,Ua: NEGATIVE
Nitrite: NEGATIVE
Protein, ur: 100 mg/dL — AB
RBC / HPF: >50 RBC/hpf (ref 0–5)
Specific Gravity, Urine: 1.017 (ref 1.005–1.030)
Squamous Epithelial / HPF: 0 /[HPF] (ref 0–5)
pH: 8 (ref 5.0–8.0)

## 2023-01-09 LAB — TROPONIN I (HIGH SENSITIVITY): Troponin I (High Sensitivity): 10 ng/L (ref <18)

## 2023-01-09 MED ORDER — SODIUM CHLORIDE 0.9 % IV BOLUS
1000.0000 mL | Freq: Once | INTRAVENOUS | Status: AC
Start: 2023-01-09 — End: 2023-01-09
  Administered 2023-01-09: 1000 mL via INTRAVENOUS

## 2023-01-09 MED ORDER — CEPHALEXIN 500 MG PO CAPS
500.0000 mg | ORAL_CAPSULE | Freq: Once | ORAL | Status: AC
  Administered 2023-01-09: 500 mg via ORAL
  Filled 2023-01-09: qty 1

## 2023-01-09 MED ORDER — CEPHALEXIN 500 MG PO CAPS: 500.0000 mg | ORAL_CAPSULE | Freq: Two times a day (BID) | ORAL | 0 refills | Status: DC

## 2023-01-09 NOTE — ED Provider Notes (Signed)
 [facility] Provider Note    Event Date/Time   First MD Initiated Contact with Patient 01/08/23 2348     (approximate)  History   Chief Complaint: Weakness and Dizziness  HPI  Daniel Owen is a 82 y.o. male with a past medical history of diabetes, hypertension, presents to the emergency department for dizziness and weakness.  According to the daughter who is here with the patient patient has memory deficits, she states for the past 2 days he has been complaining of some intermittent pain in the chest and tonight they tried to get him up from his chair and he was feeling very weak.  She states in the past when he has had weakness that is often been due to an infection of some sort or dehydration.  Patient denies any known fever no known cough.  Patient denies any chest pain here any abdominal pain.  In triage patient did note to the nurse that he was having some right-sided weakness however he denies this to myself and the daughter states she has not been made aware of this at home either.  Intact neurological exam.  Physical Exam   Triage Vital Signs: ED Triage Vitals  Encounter Vitals Group     BP 01/08/23 2014 (!) 133/97     Systolic BP Percentile --      Diastolic BP Percentile --      Pulse Rate 01/08/23 2014 (!) 134     Resp 01/08/23 2014 19     Temp 01/08/23 2014 98.5 F (36.9 C)     Temp Source 01/08/23 2014 Oral     SpO2 01/08/23 2003 97 %     Weight 01/08/23 2108 216 lb 7.9 oz (98.2 kg)     Height 01/08/23 2108 6' (1.829 m)     Head Circumference --      Peak Flow --      Pain Score 01/08/23 2107 10     Pain Loc --      Pain Education --      Exclude from Growth Chart --     Most recent vital signs: Vitals:   01/08/23 2014 01/08/23 2107  BP: (!) 133/97 125/78  Pulse: (!) 134 (!) 111  Resp: 19 18  Temp: 98.5 F (36.9 C) 98.5 F (36.9 C)  SpO2: 98% 98%    General: Awake, no distress.  CV:  Good peripheral perfusion.  Regular rate  and rhythm  Resp:  Normal effort.  Equal breath sounds bilaterally.  Abd:  No distention.  Soft, nontender.  No rebound or guarding. Other:  Equal grip strength bilaterally 5/5 motor in all extremities.  No pronator drift.  Cranial nerves intact.   ED Results / Procedures / Treatments   EKG  EKG viewed and interpreted by myself shows a sinus tachycardia 111 bpm with a slightly widened QRS, left axis deviation, largely normal intervals with nonspecific ST changes.  No ST elevation.  Most consistent with left bundle branch block.  RADIOLOGY  I have reviewed and interpreted CT head images.  No large bleed or significant abnormality seen on my evaluation. Radiology is read the CT scan is negative for acute abnormality.   MEDICATIONS ORDERED IN ED: Medications - No data to display   IMPRESSION / MDM / ASSESSMENT AND PLAN / ED COURSE  I reviewed the triage vital signs and the nursing notes.  Patient's presentation is most consistent with acute presentation with potential threat to life or bodily  function.  Patient presents to the emergency department for generalized weakness as well as some recent chest pain.  Overall the patient appears well, no distress.  Patient's workup is so far reassuring with a CBC showing just a slight leukocytosis 12,800 otherwise normal, chemistry shows no significant findings, normal renal function, anion gap of 10 no sign of dehydration.  Troponin is reassuringly negative.  CT scan shows no concerning finding.  Mild tachycardia on EKG we will dose IV fluids.  Will obtain a urine sample as well as a COVID/flu/RSV swab and continue to closely monitor.  Patient's urinalysis shows some blood but no other concerning findings.  We will cover with antibiotics as precaution and send urine culture.  After IV fluids patient states he is feeling better.  He has been able to ambulate to the restroom with minimal assistance which appears to be his baseline.  Remainder the lab  work shows no significant finding, COVID/flu/RSV negative.  Will discharge with short course of antibiotics have the patient follow-up with his doctor.  Patient and daughter agreeable to plan.  FINAL CLINICAL IMPRESSION(S) / ED DIAGNOSES   Chest pain Weakness    Note:  This document was prepared using Dragon voice recognition software and may include unintentional dictation errors.   Dorothyann Drivers, MD 01/09/23 [phone]

## 2023-01-09 NOTE — Discharge Instructions (Addendum)
 Take your antibiotic as prescribed for its entire course.  Please follow-up with your doctor within the next 2 to 3 days for recheck/reevaluation.  Return to the emergency department for any weakness or any worsening weakness or any other symptom personally concerning to yourself.

## 2023-01-10 LAB — URINE CULTURE: Culture: <10000 — AB

## 2023-03-09 LAB — LIPID PANEL
Cholesterol: 107 (ref 0–200)
HDL: 31 — AB (ref 35–70)
LDL Cholesterol: 51
Triglycerides: 144 (ref 40–160)

## 2023-03-09 LAB — HEPATIC FUNCTION PANEL
ALT: 25 U/L (ref 10–40)
AST: 26 (ref 14–40)

## 2023-03-09 LAB — COMPREHENSIVE METABOLIC PANEL WITH GFR
Albumin: 4.4 (ref 3.5–5.0)
EGFR: 85

## 2023-03-09 LAB — BASIC METABOLIC PANEL WITH GFR
Creatinine: 0.9 (ref 0.6–1.3)
Glucose: 148
Potassium: 4.6 meq/L (ref 3.5–5.1)
Sodium: 144 (ref 137–147)

## 2023-03-09 LAB — POCT INR: INR: 3.9 — AB (ref 0.80–1.20)

## 2023-05-08 ENCOUNTER — Ambulatory Visit (INDEPENDENT_AMBULATORY_CARE_PROVIDER_SITE_OTHER): Payer: Medicare Other | Admitting: Vascular Surgery

## 2023-05-08 ENCOUNTER — Ambulatory Visit (INDEPENDENT_AMBULATORY_CARE_PROVIDER_SITE_OTHER): Payer: Medicare Other

## 2023-05-08 VITALS — BP 135/68 | HR 75 | Resp 17 | Ht 72.0 in | Wt 207.6 lb

## 2023-05-08 DIAGNOSIS — D682 Hereditary deficiency of other clotting factors: Secondary | ICD-10-CM | POA: Diagnosis not present

## 2023-05-08 DIAGNOSIS — I6523 Occlusion and stenosis of bilateral carotid arteries: Secondary | ICD-10-CM | POA: Diagnosis not present

## 2023-05-08 DIAGNOSIS — E1159 Type 2 diabetes mellitus with other circulatory complications: Secondary | ICD-10-CM

## 2023-05-08 DIAGNOSIS — I10 Essential (primary) hypertension: Secondary | ICD-10-CM

## 2023-05-08 NOTE — Assessment & Plan Note (Signed)
 His duplex today shows a widely patent right carotid stent and near normal left carotid artery without significant stenosis.  On Coumadin  already for his hypercoagulable state.  Also on Lipitor.  We can now follow this on an annual basis.

## 2023-05-08 NOTE — Progress Notes (Signed)
 MRN : [id number]  Daniel Owen is a 83 y.o. (1940/11/11) male who presents with chief complaint of  Chief Complaint  Patient presents with   Venous Insufficiency  .  History of Present Illness: Patient returns in follow-up of his carotid disease.  He is doing well.  He does still have occasional dizzy spells but these are mostly when he goes from sitting to standing abruptly.  He has not had any focal neurologic symptoms.  He is a little over a year status post right carotid stent placement.  His duplex today shows a widely patent right carotid stent and near normal left carotid artery without significant stenosis.  Current Outpatient Medications  Medication Sig Dispense Refill   allopurinol  (ZYLOPRIM ) 300 MG tablet Take 300 mg by mouth daily.     atorvastatin  (LIPITOR) 40 MG tablet Take 40 mg by mouth daily.     calcium  citrate (CALCITRATE - DOSED IN MG ELEMENTAL CALCIUM ) 950 (200 Ca) MG tablet Take 2 tablets by mouth 2 (two) times daily.     losartan  (COZAAR ) 25 MG tablet Take 25 mg by mouth daily.     meclizine  (ANTIVERT ) 25 MG tablet Take 25 mg by mouth 3 (three) times daily as needed for dizziness.     metFORMIN (GLUCOPHAGE) 500 MG tablet Take 500 mg by mouth 2 (two) times daily.     potassium citrate  (UROCIT-K ) 10 MEQ (1080 MG) SR tablet Take 20 mEq by mouth 2 (two) times daily.     pregabalin  (LYRICA ) 75 MG capsule Take 75 mg by mouth 2 (two) times daily.     tamsulosin  (FLOMAX ) 0.4 MG CAPS capsule Take 0.4 mg by mouth daily.     warfarin (COUMADIN ) 7.5 MG tablet Take 3.75-7.5 mg by mouth daily. Monday, Wednesday and Friday takes 7.5 mg. Tuesday, Thursday, Saturday, Sunday 3.75 mg     acetaminophen  (TYLENOL ) 500 MG tablet Take 1,000 mg by mouth every 6 (six) hours as needed for mild pain or fever. (Patient not taking: Reported on 05/08/2023)     cephALEXin  (KEFLEX ) 500 MG capsule Take 1 capsule (500 mg total) by mouth 2 (two) times daily. (Patient not taking: Reported on 05/08/2023)  14 capsule 0   clopidogrel  (PLAVIX ) 75 MG tablet TAKE 1 TABLET BY MOUTH ONCE DAILY AT 6 MORNING (Patient not taking: Reported on 05/08/2023) 30 tablet 3   colestipol (COLESTID) 1 g tablet Take 1 g by mouth daily. (Patient not taking: Reported on 05/08/2023)     No current facility-administered medications for this visit.    Past Medical History:  Diagnosis Date   Colon cancer (HCC)    2013   Diabetes mellitus without complication (HCC)    Hypertension    Pulmonary embolism (HCC)    14 to 15 years ago    Past Surgical History:  Procedure Laterality Date   CAROTID PTA/STENT INTERVENTION Right 03/30/2022   Procedure: CAROTID PTA/STENT INTERVENTION;  Surgeon: Celso College, MD;  Location: [facility] INV[facility]IVE CV [facility];  Service: Cardiovascular;  Laterality: Right;   CHOLECYSTECTOMY     FOOT SURGERY Right    HEMORRHOID SURGERY     KNEE ARTHROSCOPY Right    LITHOTRIPSY     PARATHYROIDECTOMY     TOE AMPUTATION Right    3 toes   TONSILLECTOMY       Social History   Tobacco Use   Smoking status: Former    Current packs/day: 0.00    Average packs/day: 1 pack/day for 10.0 years (10.0  ttl pk-yrs)    Types: Cigarettes, Cigars    Start date: 02/27/1963    Quit date: 05/25/2021    Years since quitting: 1.9   Smokeless tobacco: Current    Types: Chew  Vaping Use   Vaping status: Never Used  Substance Use Topics   Alcohol use: Yes    Comment: RARE   Drug use: Never       Family History  Problem Relation Age of Onset   Arthritis/Rheumatoid Mother    Heart failure Mother    Dementia Father      Allergies  Allergen Reactions   Aspirin      Other reaction(s): Other (See Comments), Other (See Comments) Pt is unable to have aspirin  products due to the adverse reaction to warfarin. Pt is unable to have aspirin  products due to the adverse reaction to warfarin. Pt is unable to have aspirin  products due to the adverse reaction to warfarin.    Ibuprofen     Other reaction(s): Other (See  Comments), Other (See Comments) Affects clotting factors Affects clotting factors Affects clotting factors      REVIEW OF SYSTEMS (Negative unless checked)   Constitutional: [] Weight loss  [] Fever  [] Chills Cardiac: [] Chest pain   [] Chest pressure   [] Palpitations   [] Shortness of breath when laying flat   [] Shortness of breath at rest   [] Shortness of breath with exertion. Vascular:  [] Pain in legs with walking   [] Pain in legs at rest   [] Pain in legs when laying flat   [] Claudication   [] Pain in feet when walking  [] Pain in feet at rest  [] Pain in feet when laying flat   [x] History of DVT   [] Phlebitis   [x] Swelling in legs   [] Varicose veins   [] Non-healing ulcers Pulmonary:   [] Uses home oxygen   [] Productive cough   [] Hemoptysis   [] Wheeze  [] COPD   [] Asthma Neurologic:  [x] Dizziness  [] Blackouts   [] Seizures   [] History of stroke   [] History of TIA  [] Aphasia   [] Temporary blindness   [] Dysphagia   [] Weakness or numbness in arms   [] Weakness or numbness in legs Musculoskeletal:  [x] Arthritis   [] Joint swelling   [x] Joint pain   [] Low back pain Hematologic:  [] Easy bruising  [] Easy bleeding   [] Hypercoagulable state   [] Anemic  [] Hepatitis Gastrointestinal:  [] Blood in stool   [] Vomiting blood  [] Gastroesophageal reflux/heartburn   [] Abdominal pain Genitourinary:  [] Chronic kidney disease   [] Difficult urination  [] Frequent urination  [] Burning with urination   [] Hematuria Skin:  [] Rashes   [] Ulcers   [] Wounds Psychological:  [] History of anxiety   []  History of major depression.  Physical Examination  Vitals:   05/08/23 1344  BP: 135/68  Pulse: 75  Resp: 17  Weight: 207 lb 9.6 oz (94.2 kg)  Height: 6' (1.829 m)   Body mass index is 28.16 kg/m. Gen:  WD/WN, NAD. Appears younger than stated age. Head: [place]/AT, No temporalis wasting. Ear/Nose/Throat: Hearing grossly intact, nares w/o erythema or drainage, trachea midline Eyes: Conjunctiva clear. Sclera non-icteric Neck:  Supple.  No bruit  Pulmonary:  Good air movement, equal and clear to auscultation bilaterally.  Cardiac: RRR, No JVD Vascular:  Vessel Right Left  Radial Palpable Palpable       Musculoskeletal: M/S 5/5 throughout.  No deformity or atrophy. No edema. Neurologic: CN 2-12 intact. Sensation grossly intact in extremities.  Symmetrical.  Speech is fluent. Motor exam as listed above. Psychiatric: Judgment intact, Mood & affect appropriate for  pt's clinical situation. Dermatologic: No rashes or ulcers noted.  No cellulitis or open wounds.     CBC Lab Results  Component Value Date   WBC 12.8 (H) 01/08/2023   HGB 13.4 01/08/2023   HCT 40.6 01/08/2023   MCV 89.6 01/08/2023   PLT 277 01/08/2023    BMET    Component Value Date/Time   NA 134 (L) 01/08/2023 2114   K 4.2 01/08/2023 2114   CL 102 01/08/2023 2114   CO2 22 01/08/2023 2114   GLUCOSE 170 (H) 01/08/2023 2114   BUN 15 01/08/2023 2114   CREATININE 0.83 01/08/2023 2114   CALCIUM  9.0 01/08/2023 2114   GFRNONAA >60 01/08/2023 2114   CrCl cannot be calculated (Patient's most recent lab result is older than the maximum 21 days allowed.).  COAG Lab Results  Component Value Date   INR 2.3 (H) 01/08/2023   INR 2.2 (H) 03/31/2022   INR 2.9 (H) 03/30/2022    Radiology No results found.   Assessment/Plan Carotid stenosis His duplex today shows a widely patent right carotid stent and near normal left carotid artery without significant stenosis.  On Coumadin  already for his hypercoagulable state.  Also on Lipitor.  We can now follow this on an annual basis.  Hypertension blood pressure control important in reducing the progression of atherosclerotic disease. On appropriate oral medications.     Type 2 diabetes mellitus with circulatory disorder, without long-term current use of insulin  (HCC) blood glucose control important in reducing the progression of atherosclerotic disease. Also, involved in wound healing. On  appropriate medications.     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   Mikki Alexander, MD  05/08/2023 2:48 PM    This note was created with Dragon medical transcription system.  Any errors from dictation are purely unintentional

## 2023-05-29 ENCOUNTER — Encounter (INDEPENDENT_AMBULATORY_CARE_PROVIDER_SITE_OTHER): Payer: Self-pay

## 2023-06-08 LAB — COMPREHENSIVE METABOLIC PANEL WITH GFR

## 2023-06-08 LAB — LIPID PANEL

## 2023-06-08 LAB — BASIC METABOLIC PANEL WITH GFR

## 2023-06-08 LAB — HEMOGLOBIN A1C

## 2023-07-16 ENCOUNTER — Telehealth: Payer: Self-pay

## 2023-07-16 NOTE — Telephone Encounter (Signed)
 Copied from CRM [id number]. Topic: Clinical - Medical Advice >> Jul 16, 2023  4:10 PM Berwyn MATSU wrote: Reason for CRM: Patient daughter called in to advise that patient is  on Warfarin and needs to have a lab draw for July. Patients last draw 06/25/23. Patient is scheduled to see Dr. Bernardo on 08/29/23. Patient has da multiple blood clots and can not go without blood draw.  Old PCP retired and was not able to place order for patient.    CB#: [phone] Farrel Moats daughter   May you please advise.

## 2023-08-28 ENCOUNTER — Encounter: Payer: Self-pay | Admitting: Internal Medicine

## 2023-08-29 ENCOUNTER — Ambulatory Visit (INDEPENDENT_AMBULATORY_CARE_PROVIDER_SITE_OTHER): Admitting: Internal Medicine

## 2023-08-29 ENCOUNTER — Other Ambulatory Visit: Payer: Self-pay

## 2023-08-29 ENCOUNTER — Encounter: Payer: Self-pay | Admitting: Internal Medicine

## 2023-08-29 VITALS — BP 130/80 | HR 70 | Temp 97.9°F | Resp 16 | Ht 72.0 in | Wt 208.7 lb

## 2023-08-29 DIAGNOSIS — E782 Mixed hyperlipidemia: Secondary | ICD-10-CM

## 2023-08-29 DIAGNOSIS — Z95828 Presence of other vascular implants and grafts: Secondary | ICD-10-CM | POA: Diagnosis not present

## 2023-08-29 DIAGNOSIS — Z85048 Personal history of other malignant neoplasm of rectum, rectosigmoid junction, and anus: Secondary | ICD-10-CM

## 2023-08-29 DIAGNOSIS — I10 Essential (primary) hypertension: Secondary | ICD-10-CM | POA: Diagnosis not present

## 2023-08-29 DIAGNOSIS — N400 Enlarged prostate without lower urinary tract symptoms: Secondary | ICD-10-CM

## 2023-08-29 DIAGNOSIS — G629 Polyneuropathy, unspecified: Secondary | ICD-10-CM | POA: Diagnosis not present

## 2023-08-29 DIAGNOSIS — M1A9XX0 Chronic gout, unspecified, without tophus (tophi): Secondary | ICD-10-CM | POA: Diagnosis not present

## 2023-08-29 DIAGNOSIS — Z9889 Other specified postprocedural states: Secondary | ICD-10-CM

## 2023-08-29 DIAGNOSIS — Z1211 Encounter for screening for malignant neoplasm of colon: Secondary | ICD-10-CM

## 2023-08-29 DIAGNOSIS — D682 Hereditary deficiency of other clotting factors: Secondary | ICD-10-CM | POA: Diagnosis not present

## 2023-08-29 DIAGNOSIS — E1159 Type 2 diabetes mellitus with other circulatory complications: Secondary | ICD-10-CM | POA: Diagnosis not present

## 2023-08-29 DIAGNOSIS — Z23 Encounter for immunization: Secondary | ICD-10-CM

## 2023-08-29 LAB — POCT GLYCOSYLATED HEMOGLOBIN (HGB A1C): Hemoglobin A1C: 6.8 % — AB (ref 4.0–5.6)

## 2023-08-29 LAB — POCT INR
INR: 2 (ref 2.0–3.0)
POC INR: 24

## 2023-08-29 MED ORDER — POTASSIUM CITRATE ER 10 MEQ (1080 MG) PO TBCR: 20.0000 meq | EXTENDED_RELEASE_TABLET | Freq: Two times a day (BID) | ORAL | 3 refills | Status: AC

## 2023-08-29 MED ORDER — ATORVASTATIN CALCIUM 40 MG PO TABS: 40.0000 mg | ORAL_TABLET | Freq: Every day | ORAL | 1 refills | Status: AC

## 2023-08-29 MED ORDER — LOSARTAN POTASSIUM 25 MG PO TABS: ORAL_TABLET | ORAL | 1 refills | Status: AC

## 2023-08-29 MED ORDER — ALLOPURINOL 300 MG PO TABS: 300.0000 mg | ORAL_TABLET | Freq: Every day | ORAL | 1 refills | Status: AC

## 2023-08-29 MED ORDER — WARFARIN SODIUM 7.5 MG PO TABS: ORAL_TABLET | ORAL | 1 refills | Status: AC

## 2023-08-29 MED ORDER — METFORMIN HCL ER 500 MG PO TB24: 500.0000 mg | ORAL_TABLET | Freq: Two times a day (BID) | ORAL | 1 refills | Status: AC

## 2023-08-29 MED ORDER — FREESTYLE LIBRE 3 SENSOR MISC: 1.0000 | 6 refills | Status: DC

## 2023-08-29 MED ORDER — EMPAGLIFLOZIN 10 MG PO TABS: 10.0000 mg | ORAL_TABLET | Freq: Every day | ORAL | 1 refills | Status: AC

## 2023-08-29 MED ORDER — TAMSULOSIN HCL 0.4 MG PO CAPS: 0.4000 mg | ORAL_CAPSULE | Freq: Every day | ORAL | 1 refills | Status: AC

## 2023-08-29 MED ORDER — PREGABALIN 75 MG PO CAPS: 75.0000 mg | ORAL_CAPSULE | Freq: Two times a day (BID) | ORAL | 1 refills | Status: AC

## 2023-08-29 NOTE — Progress Notes (Signed)
 New Patient Office Visit  Subjective    Patient ID: Daniel Owen, male    DOB: [birth year]  Age: 83 y.o. MRN: [id number]  CC:  Chief Complaint  Patient presents with   Establish Care   Dizziness   Anticoagulation   Diabetes    HPI [name] presents to establish care. He is here with his wife today.   Discussed the use of AI scribe software for clinical note transcription with the patient, who gave verbal consent to proceed.  History of Present Illness Daniel Owen is an 83 year old male who presents with dizziness and medication management.  He experiences intermittent dizziness for about a year, with episodes lasting about 10 seconds, even while sitting. A severe episode recently required assistance. Dizziness onset coincided with starting hormone therapy. He uses meclizine  as needed.  He manages high cholesterol with atorvastatin  and had a stent placed in his right neck artery over a year ago. He is on chronic anticoagulation with warfarin for Factor V Leiden, with a recent INR of 2.0. He manages type 2 diabetes with metformin  and Jardiance , with an A1c improved to 6.8. He uses a [employer] for glucose monitoring.  He takes allopurinol  for gout, Flomax  for urinary symptoms, and Lyrica  for neuropathy. He alternates losartan  doses for blood pressure and takes various supplements. He stays hydrated to prevent dehydration. Diarrhea is associated with metformin  use.   Hypertension: -Medications: Losartan  25 mg (alternates 25 and 12.5 mg every other day), KCl 20 mEq BID -Last K checked 2/25 4.6 -Patient is compliant with above medications and reports no side effects. -Denies any SOB, CP, vision changes, LE edema or symptoms of hypotension  HLD: -Hx of right carotid stent 4/24 -Following with Dr. Marea, last seen 4/25 -Medications: Lipitor 40 mg -Patient is compliant with above medications and reports no side effects.  -Last lipid panel: 2/25 TC 107, LDL 51, HDL 31,  triglycerides 144  Diabetes, Type 2: -Last A1c 3/25 7.7% -Medications: Metformin  500 mg BID, Jardiance  10 mg added recently -Patient is compliant with the above medications but does endorse some diarrhea with the Metformin  -Eye exam: UTD -Foot exam: Due -Microalbumin: Due today -Statin: yes -PNA vaccine: Due today -Denies symptoms of hypoglycemia, polyuria, polydipsia, numbness extremities, foot ulcers/trauma.   Hx of DVT/PE/Factor 5 Leiden: -Currently on Warfarin 7.5 mg 2 days a week and 3.75 mg 5 days a week -INR 2.0 today -Last blood clot multiple years ago, no issues since being on the Warfarin  HX of Rectal Cancer: -First diagnosed in January 2013, large colon resection and chemotherapy  -Last colonoscopy [facility] 03/2015, repeat in 5 years   Hx of Gout: -Currently on Allopurinol  300 mg daily  -No recent gout flares since being on the medication  BPH: -Currently on Flomax  0.4 mg daily, symptoms controlled   Neuropathy: -Currently Lyrica  75 mg BID  Health Maintenance: -Blood work UTD  -Colon cancer screening due -Prevnar 20 due  Outpatient Encounter Medications as of 08/29/2023  Medication Sig   allopurinol  (ZYLOPRIM ) 300 MG tablet Take 300 mg by mouth daily.   atorvastatin  (LIPITOR) 40 MG tablet Take 40 mg by mouth daily.   calcium  citrate (CALCITRATE - DOSED IN MG ELEMENTAL CALCIUM ) 950 (200 Ca) MG tablet Take 2 tablets by mouth 2 (two) times daily.   losartan  (COZAAR ) 25 MG tablet Take 25 mg by mouth daily.   meclizine  (ANTIVERT ) 25 MG tablet Take 25 mg by mouth 3 (three) times daily as  needed for dizziness.   metFORMIN  (GLUCOPHAGE ) 500 MG tablet Take 500 mg by mouth 2 (two) times daily.   potassium citrate  (UROCIT-K ) 10 MEQ (1080 MG) SR tablet Take 20 mEq by mouth 2 (two) times daily.   pregabalin  (LYRICA ) 75 MG capsule Take 75 mg by mouth 2 (two) times daily.   tamsulosin  (FLOMAX ) 0.4 MG CAPS capsule Take 0.4 mg by mouth daily.   warfarin (COUMADIN ) 7.5 MG tablet  Take 3.75-7.5 mg by mouth daily. Monday, Wednesday and Friday takes 7.5 mg. Tuesday, Thursday, Saturday, Sunday 3.75 mg   acetaminophen  (TYLENOL ) 500 MG tablet Take 1,000 mg by mouth every 6 (six) hours as needed for mild pain or fever. (Patient not taking: Reported on 08/29/2023)   cephALEXin  (KEFLEX ) 500 MG capsule Take 1 capsule (500 mg total) by mouth 2 (two) times daily. (Patient not taking: Reported on 08/29/2023)   clopidogrel  (PLAVIX ) 75 MG tablet TAKE 1 TABLET BY MOUTH ONCE DAILY AT 6 MORNING (Patient not taking: Reported on 08/29/2023)   colestipol (COLESTID) 1 g tablet Take 1 g by mouth daily. (Patient not taking: Reported on 08/29/2023)   No facility-administered encounter medications on file as of 08/29/2023.    Past Medical History:  Diagnosis Date   Colon cancer (HCC)    2013   Diabetes mellitus without complication (HCC)    Hypertension    Pulmonary embolism (HCC)    14 to 15 years ago    Past Surgical History:  Procedure Laterality Date   CAROTID PTA/STENT INTERVENTION Right 03/30/2022   Procedure: CAROTID PTA/STENT INTERVENTION;  Surgeon: Marea Selinda RAMAN, MD;  Location: [facility] INVASIVE CV LAB;  Service: Cardiovascular;  Laterality: Right;   CHOLECYSTECTOMY     FOOT SURGERY Right    HEMORRHOID SURGERY     KNEE ARTHROSCOPY Right    LITHOTRIPSY     PARATHYROIDECTOMY     TOE AMPUTATION Right    3 toes   TONSILLECTOMY      Family History  Problem Relation Age of Onset   Arthritis/Rheumatoid Mother    Heart failure Mother    Dementia Father     Social History   Socioeconomic History   Marital status: Married    Spouse name: Not on file   Number of children: Not on file   Years of education: Not on file   Highest education level: Not on file  Occupational History   Not on file  Tobacco Use   Smoking status: Former    Current packs/day: 0.00    Average packs/day: 1 pack/day for 10.0 years (10.0 ttl pk-yrs)    Types: Cigarettes, Cigars    Start date: 02/27/1963     Quit date: 05/25/2021    Years since quitting: 2.2   Smokeless tobacco: Current    Types: Chew  Vaping Use   Vaping status: Never Used  Substance and Sexual Activity   Alcohol use: Not Currently    Comment: RARE   Drug use: Never   Sexual activity: Not Currently  Other Topics Concern   Not on file  Social History Narrative   Not on file   Social Drivers of Health   Financial Resource Strain: Not on file  Food Insecurity: No Food Insecurity (03/30/2022)   Hunger Vital Sign    Worried About Running Out of Food in the Last Year: Never true    Ran Out of Food in the Last Year: Never true  Transportation Needs: No Transportation Needs (03/30/2022)   PRAPARE - Transportation  Lack of Transportation (Medical): No    Lack of Transportation (Non-Medical): No  Physical Activity: Not on file  Stress: Not on file  Social Connections: Not on file  Intimate Partner Violence: Not At Risk (03/30/2022)   Humiliation, Afraid, Rape, and Kick questionnaire    Fear of Current or Ex-Partner: No    Emotionally Abused: No    Physically Abused: No    Sexually Abused: No    Review of Systems  All other systems reviewed and are negative.       Objective    BP 130/80 (Cuff Size: Large)   Pulse 70   Temp 97.9 F (36.6 C) (Oral)   Resp 16   Ht 6' (1.829 m)   Wt 208 lb 11.2 oz (94.7 kg)   SpO2 98%   BMI 28.30 kg/m   Physical Exam Constitutional:      Appearance: Normal appearance.  HENT:     Head: Normocephalic and atraumatic.     Mouth/Throat:     Mouth: Mucous membranes are moist.     Pharynx: Oropharynx is clear.  Eyes:     Extraocular Movements: Extraocular movements intact.     Conjunctiva/sclera: Conjunctivae normal.     Pupils: Pupils are equal, round, and reactive to light.  Cardiovascular:     Rate and Rhythm: Normal rate and regular rhythm.  Pulmonary:     Effort: Pulmonary effort is normal.     Breath sounds: Normal breath sounds.  Musculoskeletal:     Right  lower leg: No edema.     Left lower leg: No edema.  Skin:    General: Skin is warm and dry.  Neurological:     General: No focal deficit present.     Mental Status: He is alert. Mental status is at baseline.  Psychiatric:        Mood and Affect: Mood normal.        Behavior: Behavior normal.         Assessment & Plan:   Assessment & Plan Type 2 diabetes mellitus with diabetic peripheral neuropathy Diabetes well-controlled, A1c improved to 6.8. Neuropathy managed with pregabalin . Jardiance  may contribute to A1c improvement. - Continue metformin , switch to extended-release. - Continue Jardiance  10 mg daily. - Refill Freestyle Libre, consider upgrade to [employer] 3 Plus. - Perform annual urine test for kidney disease. - Perform annual foot exam.  Essential hypertension Blood pressure well-controlled at 130/80 mmHg with losartan  regimen. - Continue losartan  25 mg alternating with 12.5 mg every other day. - Monitor blood pressure regularly.  Hyperlipidemia Cholesterol well-controlled with atorvastatin . LDL at 51, below target. - Continue atorvastatin  as prescribed.  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 Warfarin dosage adjusted accordingly. - Continue warfarin: 7.5 mg two days a week, half tablet five days a week. - Recheck INR in one month.  History of right carotid artery stent placement for carotid artery stenosis Stent placed over a year ago, artery open on follow-up ultrasound. - Continue annual follow-up with vascular specialist.  History of colorectal cancer, status post resection and chemotherapy Colorectal cancer post-resection and chemotherapy. Due for surveillance colonoscopy. - Refer to GI for colonoscopy.  Gout Gout well-controlled with allopurinol . No recent flares. - Continue allopurinol  as prescribed.  Benign prostatic hyperplasia with lower urinary tract  symptoms Symptoms managed with tamsulosin . No significant nocturia. - Continue tamsulosin  as prescribed.  Hypokalemia, on potassium supplementation Potassium stable at  4.6 with ongoing supplementation. - Continue potassium citrate  supplementation. - Monitor potassium levels periodically.  - POCT HgB A1C - Continuous Glucose Sensor (FREESTYLE LIBRE 3 SENSOR) MISC; 1 each by Does not apply route every 14 (fourteen) days. Place 1 sensor on the skin every 14 days. Use to check glucose continuously  Dispense: 2 each; Refill: 6 - Urine Microalbumin w/creat. ratio - empagliflozin  (JARDIANCE ) 10 MG TABS tablet; Take 1 tablet (10 mg total) by mouth daily.  Dispense: 90 tablet; Refill: 1 - metFORMIN  (GLUCOPHAGE -XR) 500 MG 24 hr tablet; Take 1 tablet (500 mg total) by mouth 2 (two) times daily with a meal.  Dispense: 180 tablet; Refill: 1 - losartan  (COZAAR ) 25 MG tablet; Take 25 mg alternated with 12.5 mg (1/2 tablet) every other day.  Dispense: 90 tablet; Refill: 1 - potassium citrate  (UROCIT-K ) 10 MEQ (1080 MG) SR tablet; Take 2 tablets (20 mEq total) by mouth 2 (two) times daily.  Dispense: 180 tablet; Refill: 3 - atorvastatin  (LIPITOR) 40 MG tablet; Take 1 tablet (40 mg total) by mouth daily.  Dispense: 90 tablet; Refill: 1 - POCT INR - warfarin (COUMADIN ) 7.5 MG tablet; Take 7.5 mg 2 days a week and 3.75 (half tablet) all other days.  Dispense: 90 tablet; Refill: 1 - allopurinol  (ZYLOPRIM ) 300 MG tablet; Take 1 tablet (300 mg total) by mouth daily.  Dispense: 90 tablet; Refill: 1 - pregabalin  (LYRICA ) 75 MG capsule; Take 1 capsule (75 mg total) by mouth 2 (two) times daily.  Dispense: 180 capsule; Refill: 1 - tamsulosin  (FLOMAX ) 0.4 MG CAPS capsule; Take 1 capsule (0.4 mg total) by mouth daily.  Dispense: 90 capsule; Refill: 1 - Ambulatory referral to Gastroenterology - Pneumococcal conjugate vaccine 20-valent (Prevnar 20)    Return in about 4 weeks (around 09/26/2023) for INR check .    Sharyle Fischer, DO

## 2023-08-30 ENCOUNTER — Ambulatory Visit: Payer: Self-pay | Admitting: Internal Medicine

## 2023-08-30 LAB — MICROALBUMIN / CREATININE URINE RATIO
Creatinine, Urine: 27 mg/dL (ref 20–320)
Microalb Creat Ratio: 15 mg/g{creat} (ref <30)
Microalb, Ur: 0.4 mg/dL

## 2023-09-06 NOTE — Telephone Encounter (Signed)
 Copied from CRM #[id number]. Topic: Clinical - Prescription Issue >> Sep 06, 2023  3:11 PM DeAngela L wrote: Reason for CRM: Wife Erminio call cause the patient just got a refill for the sensors and after picking it up from the pharmacy the patient noticed it was for the Continuous Glucose Sensor (FREESTYLE LIBRE 3 SENSOR) MISC Patient has a Therapist, art 2 reader and now the pharmacy says the patient needs a Freestyle Libre 3 reader in order to check his sensor and the patient is calling to see if the office could write a prescription as soon as possible since the patient has no way to check his sensor now   [place] wife num [phone]  [facility] - [place], [place] - [address] [place] [address] Phone: [phone] Fax: [phone]

## 2023-09-07 ENCOUNTER — Other Ambulatory Visit: Payer: Self-pay | Admitting: Internal Medicine

## 2023-09-07 ENCOUNTER — Other Ambulatory Visit: Payer: Self-pay | Admitting: Emergency Medicine

## 2023-09-07 DIAGNOSIS — E1159 Type 2 diabetes mellitus with other circulatory complications: Secondary | ICD-10-CM

## 2023-09-07 MED ORDER — FREESTYLE LIBRE 3 READER DEVI: 1.0000 | Freq: Every day | 0 refills | Status: DC

## 2023-09-27 ENCOUNTER — Ambulatory Visit: Admitting: Internal Medicine

## 2023-09-27 ENCOUNTER — Encounter: Payer: Self-pay | Admitting: Internal Medicine

## 2023-09-27 ENCOUNTER — Other Ambulatory Visit: Payer: Self-pay

## 2023-09-27 VITALS — BP 120/76 | HR 73 | Temp 97.9°F | Resp 16 | Ht 72.0 in | Wt 209.5 lb

## 2023-09-27 DIAGNOSIS — H8110 Benign paroxysmal vertigo, unspecified ear: Secondary | ICD-10-CM | POA: Diagnosis not present

## 2023-09-27 DIAGNOSIS — E1159 Type 2 diabetes mellitus with other circulatory complications: Secondary | ICD-10-CM | POA: Diagnosis not present

## 2023-09-27 DIAGNOSIS — Z9889 Other specified postprocedural states: Secondary | ICD-10-CM | POA: Diagnosis not present

## 2023-09-27 DIAGNOSIS — Z95828 Presence of other vascular implants and grafts: Secondary | ICD-10-CM | POA: Diagnosis not present

## 2023-09-27 DIAGNOSIS — D682 Hereditary deficiency of other clotting factors: Secondary | ICD-10-CM | POA: Diagnosis not present

## 2023-09-27 LAB — POCT INR
INR: 2.7 (ref 2.0–3.0)
POC INR: 32.8

## 2023-09-27 NOTE — Progress Notes (Signed)
 Established Patient Office Visit  Subjective    Patient ID: Daniel Owen, male    DOB: July 23, 1940  Age: 83 y.o. MRN: [id number]  CC:  Chief Complaint  Patient presents with   Anticoagulation    HPI Daniel Owen presents to recheck INR today.  Discussed the use of AI scribe software for clinical note transcription with the patient, who gave verbal consent to proceed.  History of Present Illness Daniel Owen is an 83 year old male who presents for an INR check and evaluation of dizziness.  His INR is 2.7, within the therapeutic range. He experiences dizziness, particularly when lying down or turning over in bed, described as 'room spinning'. The dizziness is most pronounced when lying flat or sitting in his easy chair. He sometimes requires a walker due to dizziness in the morning. He denies symptoms such as narrowed vision or hearing rushing blood in his ears. He uses meclizine  once a day as needed, which helps alleviate symptoms. He consumes about three to four glasses of water a day, which may contribute to his symptoms. His blood sugar levels occasionally exceed 200 mg/dL, but his J8r is 3.1% with Jardiance  for diabetes management.   Hypertension: -Medications: Losartan  25 mg (alternates 25 and 12.5 mg every other day), KCl 20 mEq BID -Last K checked 2/25 4.6 -Patient is compliant with above medications and reports no side effects. -Denies any SOB, CP, vision changes, LE edema or symptoms of hypotension  HLD: -Hx of right carotid stent 4/24 -Following with Dr. Marea, last seen 4/25 -Medications: Lipitor 40 mg -Patient is compliant with above medications and reports no side effects.  -Last lipid panel: 2/25 TC 107, LDL 51, HDL 31, triglycerides 144  Diabetes, Type 2: -Last A1c 8/25 6.8% -Medications: Metformin  500 mg BID, Jardiance  10 mg added recently -Patient is compliant with the above medications but does endorse some diarrhea with the Metformin  -Eye exam:  UTD -Foot exam: Due -Microalbumin: UTD -Statin: yes -PNA vaccine: Due today -Denies symptoms of hypoglycemia, polyuria, polydipsia, numbness extremities, foot ulcers/trauma.   Hx of DVT/PE/Factor 5 Leiden: -Currently on Warfarin 7.5 mg 2 days a week and 3.75 mg 5 days a week -INR 2.7 today -Last blood clot multiple years ago, no issues since being on the Warfarin  HX of Rectal Cancer: -First diagnosed in January 2013, large colon resection and chemotherapy  -Last colonoscopy [facility] 03/2015, repeat in 5 years   Hx of Gout: -Currently on Allopurinol  300 mg daily  -No recent gout flares since being on the medication  BPH: -Currently on Flomax  0.4 mg daily, symptoms controlled   Neuropathy: -Currently Lyrica  75 mg BID  Health Maintenance: -Blood work UTD  -Colon cancer screening due  Outpatient Encounter Medications as of 09/27/2023  Medication Sig   allopurinol  (ZYLOPRIM ) 300 MG tablet Take 1 tablet (300 mg total) by mouth daily.   atorvastatin  (LIPITOR) 40 MG tablet Take 1 tablet (40 mg total) by mouth daily.   calcium  citrate (CALCITRATE - DOSED IN MG ELEMENTAL CALCIUM ) 950 (200 Ca) MG tablet Take 2 tablets by mouth 2 (two) times daily.   Continuous Glucose Receiver (FREESTYLE LIBRE 3 READER) DEVI 1 each by Does not apply route daily.   empagliflozin  (JARDIANCE ) 10 MG TABS tablet Take 1 tablet (10 mg total) by mouth daily.   losartan  (COZAAR ) 25 MG tablet Take 25 mg alternated with 12.5 mg (1/2 tablet) every other day.   meclizine  (ANTIVERT ) 25 MG tablet Take 25 mg by  mouth 3 (three) times daily as needed for dizziness.   metFORMIN  (GLUCOPHAGE -XR) 500 MG 24 hr tablet Take 1 tablet (500 mg total) by mouth 2 (two) times daily with a meal.   potassium citrate  (UROCIT-K ) 10 MEQ (1080 MG) SR tablet Take 2 tablets (20 mEq total) by mouth 2 (two) times daily.   pregabalin  (LYRICA ) 75 MG capsule Take 1 capsule (75 mg total) by mouth 2 (two) times daily.   tamsulosin  (FLOMAX ) 0.4 MG CAPS  capsule Take 1 capsule (0.4 mg total) by mouth daily.   warfarin (COUMADIN ) 7.5 MG tablet Take 7.5 mg 2 days a week and 3.75 (half tablet) all other days.   Continuous Glucose Sensor (FREESTYLE LIBRE 3 SENSOR) MISC 1 each by Does not apply route every 14 (fourteen) days. Place 1 sensor on the skin every 14 days. Use to check glucose continuously   No facility-administered encounter medications on file as of 09/27/2023.    Past Medical History:  Diagnosis Date   Colon cancer (HCC)    2013   Diabetes mellitus without complication (HCC)    Hypertension    Pulmonary embolism (HCC)    14 to 15 years ago    Past Surgical History:  Procedure Laterality Date   CAROTID PTA/STENT INTERVENTION Right 03/30/2022   Procedure: CAROTID PTA/STENT INTERVENTION;  Surgeon: Marea Selinda RAMAN, MD;  Location: [facility] INVASIVE [facility];  Service: Cardiovascular;  Laterality: Right;   CHOLECYSTECTOMY     FOOT SURGERY Right    HEMORRHOID SURGERY     KNEE ARTHROSCOPY Right    LITHOTRIPSY     PARATHYROIDECTOMY     TOE AMPUTATION Right    3 toes   TONSILLECTOMY      Family History  Problem Relation Age of Onset   Arthritis/Rheumatoid Mother    Heart failure Mother    Dementia Father     Social History   Socioeconomic History   Marital status: Married    Spouse name: Not on file   Number of children: Not on file   Years of education: Not on file   Highest education level: Not on file  Occupational History   Not on file  Tobacco Use   Smoking status: Former    Current packs/day: 0.00    Average packs/day: 1 pack/day for 10.0 years (10.0 ttl pk-yrs)    Types: Cigarettes, Cigars    Start date: 02/27/1963    Quit date: 05/25/2021    Years since quitting: 2.3   Smokeless tobacco: Current    Types: Chew  Vaping Use   Vaping status: Never Used  Substance and Sexual Activity   Alcohol use: Not Currently    Comment: RARE   Drug use: Never   Sexual activity: Not Currently  Other Topics Concern   Not on  file  Social History Narrative   Not on file   Social Drivers of Health   Financial Resource Strain: Low Risk  (09/03/2023)   Received from [facility]   Overall Financial Resource Strain (CARDIA)    Difficulty of Paying Living Expenses: Not hard at all  Food Insecurity: No Food Insecurity (09/03/2023)   Received from [facility]   Hunger Vital Sign    Within the past 12 months, you worried that your food would run out before you got the money to buy more.: Never true    Within the past 12 months, the food you bought just didn't last and you didn't have money to get  more.: Never true  Transportation Needs: No Transportation Needs (09/03/2023)   Received from [facility]    In the past 12 months, has lack of transportation kept you from medical appointments or from getting medications?: No    Lack of Transportation (Non-Medical): No  Physical Activity: Not on file  Stress: Not on file  Social Connections: Not on file  Intimate Partner Violence: Not At Risk (03/30/2022)   Humiliation, Afraid, Rape, and Kick questionnaire    Fear of Current or Ex-Partner: No    Emotionally Abused: No    Physically Abused: No    Sexually Abused: No    Review of Systems  Neurological:  Positive for dizziness.  All other systems reviewed and are negative.       Objective    BP 120/76 (Cuff Size: Large)   Pulse 73   Temp 97.9 F (36.6 C) (Oral)   Resp 16   Ht 6' (1.829 m)   Wt 209 lb 8 oz (95 kg)   SpO2 96%   BMI 28.41 kg/m   Physical Exam Constitutional:      Appearance: Normal appearance.  HENT:     Head: Normocephalic and atraumatic.     Right Ear: Tympanic membrane, ear canal and external ear normal.     Left Ear: Tympanic membrane, ear canal and external ear normal.  Eyes:     Conjunctiva/sclera: Conjunctivae normal.  Cardiovascular:     Rate and Rhythm: Normal rate and regular rhythm.  Pulmonary:      Effort: Pulmonary effort is normal.     Breath sounds: Normal breath sounds.  Skin:    General: Skin is warm and dry.  Neurological:     General: No focal deficit present.     Mental Status: He is alert. Mental status is at baseline.  Psychiatric:        Mood and Affect: Mood normal.        Behavior: Behavior normal.         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 Meclizine  provides symptomatic relief. - Print information on BPPV and Epley maneuver for home exercises. - Instruct on performing Epley maneuver at home with safety precautions. - Advise meclizine  as needed, up to three times daily, max 100 mg/day. - Consider referral to vestibular physical therapy if home exercises ineffective.  Type 2 diabetes mellitus without complications Blood glucose occasionally exceeds 200 mg/dL postprandially; fasting levels controlled. HbA1c improved to 6.8%. Jardiance  effective; discussed side effects and emphasized hydration. - Continue current diabetes medications, including Jardiance . - Monitor blood glucose levels regularly. - Maintain hydration, 40-50 ounces of water daily. - Report signs of UTIs or fungal infections promptly.  Hereditary deficiency of other clotting factors INR is 2.7, within therapeutic range for anticoagulation. - Continue current warfarin regimen. - Monitor INR regularly.  - POCT INR  Return in about 10 weeks (around 12/06/2023).   Sharyle Fischer, DO

## 2023-09-27 NOTE — Patient Instructions (Signed)
 Benign Positional Vertigo Vertigo is the feeling that you or your surroundings are moving when they are not. Benign positional vertigo is the most common form of vertigo. This is usually a harmless condition (benign). This condition is positional. This means that symptoms are triggered by certain movements and positions. This condition can be dangerous if it occurs while you are doing something that could cause harm to yourself or others. This includes activities such as driving or operating machinery. What are the causes? The inner ear has fluid-filled canals that help your brain sense movement and balance. When the fluid moves, the brain receives messages about your body's position. With benign positional vertigo, calcium  crystals in the inner ear break free and disturb the inner ear area. This causes your brain to receive confusing messages about your body's position. What increases the risk? You are more likely to develop this condition if: You are a woman. You are 83 years of age or older. You have recently had a head injury. You have an inner ear disease. What are the signs or symptoms? Symptoms of this condition usually happen when you move your head or your eyes in different directions. Symptoms may start suddenly and usually last for less than a minute. They include: Loss of balance and falling. Feeling like you are spinning or moving. Feeling like your surroundings are spinning or moving. Nausea and vomiting. Blurred vision. Dizziness. Involuntary eye movement (nystagmus). Symptoms can be mild and cause only minor problems, or they can be severe and interfere with daily life. Episodes of benign positional vertigo may return (recur) over time. Symptoms may also improve over time. How is this diagnosed? This condition may be diagnosed based on: Your medical history. A physical exam of the head, neck, and ears. Positional tests to check for or stimulate vertigo. You may be asked to  turn your head and change positions, such as going from sitting to lying down. A health care provider will watch for symptoms of vertigo. You may be referred to a health care provider who specializes in ear, nose, and throat problems (ENT or otolaryngologist) or a provider who specializes in disorders of the nervous system (neurologist). How is this treated?  This condition may be treated in a session in which your health care provider moves your head in specific positions to help the displaced crystals in your inner ear move. Treatment for this condition may take several sessions. Surgery may be needed in severe cases, but this is rare. In some cases, benign positional vertigo may resolve on its own in 2-4 weeks. Follow these instructions at home: Safety Move slowly. Avoid sudden body or head movements or certain positions, as told by your health care provider. Avoid driving or operating machinery until your health care provider says it is safe. Avoid doing any tasks that would be dangerous to you or others if vertigo occurs. If you have trouble walking or keeping your balance, try using a cane for stability. If you feel dizzy or unstable, sit down right away. Return to your normal activities as told by your health care provider. Ask your health care provider what activities are safe for you. General instructions Take over-the-counter and prescription medicines only as told by your health care provider. Drink enough fluid to keep your urine pale yellow. Keep all follow-up visits. This is important. Contact a health care provider if: You have a fever. Your condition gets worse or you develop new symptoms. Your family or friends notice any behavioral changes.  You have nausea or vomiting that gets worse. You have numbness or a prickling and tingling sensation. Get help right away if you: Have difficulty speaking or moving. Are always dizzy or faint. Develop severe headaches. Have weakness in  your legs or arms. Have changes in your hearing or vision. Develop a stiff neck. Develop sensitivity to light. These symptoms may represent a serious problem that is an emergency. Do not wait to see if the symptoms will go away. Get medical help right away. Call your local emergency services (911 in the [place] Do not drive yourself to the hospital. Summary Vertigo is the feeling that you or your surroundings are moving when they are not. Benign positional vertigo is the most common form of vertigo. This condition is caused by calcium  crystals in the inner ear that become displaced. This causes a disturbance in an area of the inner ear that helps your brain sense movement and balance. Symptoms include loss of balance and falling, feeling that you or your surroundings are moving, nausea and vomiting, and blurred vision. This condition can be diagnosed based on symptoms, a physical exam, and positional tests. Follow safety instructions as told by your health care provider and keep all follow-up visits. This is important. This information is not intended to replace advice given to you by your health care provider. Make sure you discuss any questions you have with your health care provider. Document Revised: 07/17/2022 Document Reviewed: 07/17/2022 Elsevier Patient Education  2024 Elsevier Inc.  How to Perform the Epley Maneuver The Epley maneuver is an exercise that relieves symptoms of vertigo. Vertigo is the feeling that you or your surroundings are moving when they are not. When you feel vertigo, you may feel like the room is spinning and may have trouble walking. The Epley maneuver is used for a type of vertigo caused by a calcium  deposit in a part of the inner ear. The maneuver involves changing head positions to help the deposit move out of the area. You can do this maneuver at home whenever you have symptoms of vertigo. You can repeat it in 24 hours if your vertigo has not gone away. Even  though the Epley maneuver may relieve your vertigo for a few weeks, it is possible that your symptoms will return. This maneuver relieves vertigo, but it does not relieve dizziness. What are the risks? If it is done correctly, the Epley maneuver is considered safe. Sometimes it can lead to dizziness or nausea that goes away after a short time. If you develop other symptoms--such as changes in vision, weakness, or numbness--stop doing the maneuver and call your health care provider. Supplies needed: A bed or table. A pillow. How to do the Epley maneuver     Sit on the edge of a bed or table with your back straight and your legs extended or hanging over the edge of the bed or table. Turn your head halfway toward the affected ear or side as told by your health care provider. Lie backward quickly with your head turned until you are lying flat on your back. Your head should dangle (head-hanging position). You may want to position a pillow under your shoulders. Hold this position for at least 30 seconds. If you feel dizzy or have symptoms of vertigo, continue to hold the position until the symptoms stop. Turn your head to the opposite direction until your unaffected ear is facing down. Your head should continue to dangle. Hold this position for at least 30 seconds.  If you feel dizzy or have symptoms of vertigo, continue to hold the position until the symptoms stop. Turn your whole body to the same side as your head so that you are positioned on your side. Your head will now be nearly facedown and no longer needs to dangle. Hold for at least 30 seconds. If you feel dizzy or have symptoms of vertigo, continue to hold the position until the symptoms stop. Sit back up. You can repeat the maneuver in 24 hours if your vertigo does not go away. Follow these instructions at home: For 24 hours after doing the Epley maneuver: Keep your head in an upright position. When lying down to sleep or rest, keep your  head raised (elevated) with two or more pillows. Avoid excessive neck movements. Activity Do not drive or use machinery if you feel dizzy. After doing the Epley maneuver, return to your normal activities as told by your health care provider. Ask your health care provider what activities are safe for you. General instructions Drink enough fluid to keep your urine pale yellow. Do not drink alcohol. Take over-the-counter and prescription medicines only as told by your health care provider. Keep all follow-up visits. This is important. Preventing vertigo symptoms Ask your health care provider if there is anything you should do at home to prevent vertigo. He or she may recommend that you: Keep your head elevated with two or more pillows while you sleep. Do not sleep on the side of your affected ear. Get up slowly from bed. Avoid sudden movements during the day. Avoid extreme head positions or movement, such as looking up or bending over. Contact a health care provider if: Your vertigo gets worse. You have other symptoms, including: Nausea. Vomiting. Headache. Get help right away if you: Have vision changes. Have a headache or neck pain that is severe or getting worse. Cannot stop vomiting. Have new numbness or weakness in any part of your body. These symptoms may represent a serious problem that is an emergency. Do not wait to see if the symptoms will go away. Get medical help right away. Call your local emergency services (911 in the [place] Do not drive yourself to the hospital. Summary Vertigo is the feeling that you or your surroundings are moving when they are not. The Epley maneuver is an exercise that relieves symptoms of vertigo. If the Epley maneuver is done correctly, it is considered safe. This information is not intended to replace advice given to you by your health care provider. Make sure you discuss any questions you have with your health care provider. Document Revised:  09/22/2022 Document Reviewed: 09/22/2022 Elsevier Patient Education  2024 [employer].

## 2023-10-12 ENCOUNTER — Other Ambulatory Visit: Payer: Self-pay | Admitting: Internal Medicine

## 2023-10-12 DIAGNOSIS — E1159 Type 2 diabetes mellitus with other circulatory complications: Secondary | ICD-10-CM

## 2023-10-15 DIAGNOSIS — N200 Calculus of kidney: Secondary | ICD-10-CM | POA: Diagnosis not present

## 2023-10-15 NOTE — Telephone Encounter (Signed)
 Requested Prescriptions  Pending Prescriptions Disp Refills   Continuous Glucose Receiver (FREESTYLE LIBRE 3 READER) DEVI [Pharmacy Med Name: FREESTYLE LIBRE 3 READER DEVICE] 1 each 0    Sig: USE AS DIRECTED.     Endocrinology: Diabetes - Testing Supplies Passed - 10/15/2023  9:51 AM      Passed - Valid encounter within last 12 months    Recent Outpatient Visits           2 weeks ago Benign paroxysmal positional vertigo, unspecified laterality   [facility] Bernardo Fend, DO   1 month ago Type 2 diabetes mellitus with other circulatory complication, without long-term current use of insulin  [facility])   [facility] Bernardo Fend, [place]

## 2023-10-16 ENCOUNTER — Telehealth: Payer: Self-pay

## 2023-10-16 NOTE — Telephone Encounter (Signed)
 Need PA for freestyle 3 libre sensors and reader

## 2023-10-16 NOTE — Telephone Encounter (Signed)
 Copied from CRM [id number]. Topic: Clinical - Prescription Issue >> Oct 16, 2023  2:59 PM Darshell M wrote: Reason for CRM: Patient called the pharmacy and the pharmacist said insurance is requesting additional information before filling the prescription.   This is the patient's preferred pharmacy:  [facility] - [place], [place] - [address] [place] [address] Phone: [phone] Fax: [phone]

## 2023-10-17 ENCOUNTER — Telehealth: Payer: Self-pay

## 2023-10-17 ENCOUNTER — Other Ambulatory Visit (HOSPITAL_COMMUNITY): Payer: Self-pay

## 2023-10-17 ENCOUNTER — Other Ambulatory Visit: Payer: Self-pay | Admitting: Emergency Medicine

## 2023-10-17 ENCOUNTER — Telehealth: Payer: Self-pay | Admitting: Pharmacy Technician

## 2023-10-17 DIAGNOSIS — E1159 Type 2 diabetes mellitus with other circulatory complications: Secondary | ICD-10-CM

## 2023-10-17 MED ORDER — ACCU-CHEK GUIDE TEST VI STRP: ORAL_STRIP | 12 refills | Status: AC

## 2023-10-17 MED ORDER — ACCU-CHEK GUIDE ME W/DEVICE KIT: 1.0000 [IU] | PACK | Freq: Every day | 0 refills | Status: AC

## 2023-10-17 NOTE — Telephone Encounter (Signed)
 Patient notified

## 2023-10-17 NOTE — Telephone Encounter (Signed)
 Left message for patient informing him insurance will only cover the glucometer where you stick your finger. Please send in a script for patient. Pend for refills

## 2023-10-17 NOTE — Telephone Encounter (Signed)
 Pharmacy Patient Advocate Encounter   Received notification from Onbase that prior authorization for FreeStyle Libre 3 Sensor is required/requested.   Insurance verification completed.   The patient is insured through [facility].   Per test claim: Medicare will not cover CGMS unless the patient is insulin  dependent (at least three daily insulin  rejections) or has a documented history of probematic hypoglycemia

## 2023-10-17 NOTE — Telephone Encounter (Signed)
 Not covered by ins.  Copied from CRM [id number]. Topic: Clinical - Medication Prior Auth >> Oct 17, 2023 12:28 PM Daniel Owen wrote: Reason for CRM:  [employer] needs a new prior authorization for  Continuous Glucose Sensor (FREESTYLE LIBRE 3 SENSOR) MISC  Call back number [phone] or Ata at [phone]

## 2023-10-17 NOTE — Telephone Encounter (Signed)
 Dis it say anything it will pay for

## 2023-10-17 NOTE — Telephone Encounter (Signed)
 Good afternoon Daniel Owen, his plan will cover Contour Plus Blue and AccuChek Guide

## 2023-10-18 ENCOUNTER — Other Ambulatory Visit (HOSPITAL_COMMUNITY): Payer: Self-pay

## 2023-10-19 ENCOUNTER — Ambulatory Visit

## 2023-10-19 VITALS — BP 120/76 | Ht 72.0 in | Wt 215.0 lb

## 2023-10-19 DIAGNOSIS — Z0000 Encounter for general adult medical examination without abnormal findings: Secondary | ICD-10-CM | POA: Diagnosis not present

## 2023-10-19 NOTE — Patient Instructions (Signed)
 Daniel Owen,  Thank you for taking the time for your Medicare Wellness Visit. I appreciate your continued commitment to your health goals. Please review the care plan we discussed, and feel free to reach out if I can assist you further.  Medicare recommends these wellness visits once per year to help you and your care team stay ahead of potential health issues. These visits are designed to focus on prevention, allowing your provider to concentrate on managing your acute and chronic conditions during your regular appointments.  Please note that Annual Wellness Visits do not include a physical exam. Some assessments may be limited, especially if the visit was conducted virtually. If needed, we may recommend a separate in-person follow-up with your provider.  Ongoing Care Seeing your primary care provider every 3 to 6 months helps us  monitor your health and provide consistent, personalized care.   Referrals If a referral was made during today's visit and you haven't received any updates within two weeks, please contact the referred provider directly to check on the status.  Recommended Screenings:  Health Maintenance  Topic Date Due   COVID-19 Vaccine (1) Never done   Complete foot exam   Never done   DTaP/Tdap/Td vaccine (1 - Tdap) Never done   Zoster (Shingles) Vaccine (1 of 2) Never done   Eye exam for diabetics  12/24/2013   Flu Shot  08/10/2023   Hemoglobin A1C  02/29/2024   Yearly kidney function blood test for diabetes  06/07/2024   Yearly kidney health urinalysis for diabetes  08/28/2024   Medicare Annual Wellness Visit  10/18/2024   Pneumococcal Vaccine for age over 61  Completed   Meningitis B Vaccine  Aged Out       10/19/2023   11:47 AM  Advanced Directives  Does Patient Have a Medical Advance Directive? Yes  Type of Estate agent of [place];Living will  Does patient want to make changes to medical advance directive? No - Patient declined  Copy of  Healthcare Power of Attorney in Chart? No - copy requested   Advance Care Planning is important because it: Ensures you receive medical care that aligns with your values, goals, and preferences. Provides guidance to your family and loved ones, reducing the emotional burden of decision-making during critical moments.  Vision: Annual vision screenings are recommended for early detection of glaucoma, cataracts, and diabetic retinopathy. These exams can also reveal signs of chronic conditions such as diabetes and high blood pressure.  Dental: Annual dental screenings help detect early signs of oral cancer, gum disease, and other conditions linked to overall health, including heart disease and diabetes.  Please see the attached documents for additional preventive care recommendations.

## 2023-10-19 NOTE — Progress Notes (Signed)
 Because this visit was a virtual/telehealth visit,  certain criteria was not obtained, such a blood pressure, CBG if applicable, and timed get up and go. Any medications not marked as taking were not mentioned during the medication reconciliation part of the visit. Any vitals not documented were not able to be obtained due to this being a telehealth visit or patient was unable to self-report a recent blood pressure reading due to a lack of equipment at home via telehealth. Vitals that have been documented are verbally provided by the patient.   This visit was performed by a medical professional under my direct supervision. I was immediately available for consultation/collaboration. I have reviewed and agree with the Annual Wellness Visit documentation.  Subjective:   Daniel Owen is a 83 y.o. who presents for a Medicare Wellness preventive visit.  As a reminder, Annual Wellness Visits don't include a physical exam, and some assessments may be limited, especially if this visit is performed virtually. We may recommend an in-person follow-up visit with your provider if needed.  Visit Complete: Virtual I connected with  Daniel Owen on 10/19/23 by a audio enabled telemedicine application and verified that I am speaking with the correct person using two identifiers.  Patient Location: Home  Provider Location: Home Office  I discussed the limitations of evaluation and management by telemedicine. The patient expressed understanding and agreed to proceed.  Vital Signs: Because this visit was a virtual/telehealth visit, some criteria may be missing or patient reported. Any vitals not documented were not able to be obtained and vitals that have been documented are patient reported.  VideoDeclined- This patient declined Librarian, academic. Therefore the visit was completed with audio only.  Persons Participating in Visit: Patient.  AWV Questionnaire: No: Patient  Medicare AWV questionnaire was not completed prior to this visit.  Cardiac Risk Factors include: advanced age (>25men, >41 women);male gender;diabetes mellitus     Objective:    Today's Vitals   10/19/23 1143  BP: 120/76  Weight: 215 lb (97.5 kg)  Height: 6' (1.829 m)   Body mass index is 29.16 kg/m.     10/19/2023   11:47 AM 01/08/2023    9:13 PM 03/30/2022    4:00 PM 03/30/2022   11:04 AM  Advanced Directives  Does Patient Have a Medical Advance Directive? Yes Yes No No  Type of Estate agent of [place];Living will Living will    Does patient want to make changes to medical advance directive? No - Patient declined No - Patient declined    Copy of Healthcare Power of Attorney in Chart? No - copy requested     Would patient like information on creating a medical advance directive?   No - Patient declined No - Patient declined;No - Guardian declined    Current Medications (verified) Outpatient Encounter Medications as of 10/19/2023  Medication Sig   allopurinol  (ZYLOPRIM ) 300 MG tablet Take 1 tablet (300 mg total) by mouth daily.   atorvastatin  (LIPITOR) 40 MG tablet Take 1 tablet (40 mg total) by mouth daily.   Blood Glucose Monitoring Suppl (ACCU-CHEK GUIDE ME) w/Device KIT 1 Units by Does not apply route daily.   calcium  citrate (CALCITRATE - DOSED IN MG ELEMENTAL CALCIUM ) 950 (200 Ca) MG tablet Take 2 tablets by mouth 2 (two) times daily.   Continuous Glucose Receiver (FREESTYLE LIBRE 3 READER) DEVI USE AS DIRECTED.   Continuous Glucose Sensor (FREESTYLE LIBRE 3 SENSOR) MISC 1 each by Does not apply  route every 14 (fourteen) days. Place 1 sensor on the skin every 14 days. Use to check glucose continuously   empagliflozin  (JARDIANCE ) 10 MG TABS tablet Take 1 tablet (10 mg total) by mouth daily.   glucose blood (ACCU-CHEK GUIDE TEST) test strip Use as instructed   losartan  (COZAAR ) 25 MG tablet Take 25 mg alternated with 12.5 mg (1/2 tablet) every other  day.   meclizine  (ANTIVERT ) 25 MG tablet Take 25 mg by mouth 3 (three) times daily as needed for dizziness.   metFORMIN  (GLUCOPHAGE -XR) 500 MG 24 hr tablet Take 1 tablet (500 mg total) by mouth 2 (two) times daily with a meal.   potassium citrate  (UROCIT-K ) 10 MEQ (1080 MG) SR tablet Take 2 tablets (20 mEq total) by mouth 2 (two) times daily.   pregabalin  (LYRICA ) 75 MG capsule Take 1 capsule (75 mg total) by mouth 2 (two) times daily.   tamsulosin  (FLOMAX ) 0.4 MG CAPS capsule Take 1 capsule (0.4 mg total) by mouth daily.   warfarin (COUMADIN ) 7.5 MG tablet Take 7.5 mg 2 days a week and 3.75 (half tablet) all other days.   No facility-administered encounter medications on file as of 10/19/2023.    Allergies (verified) Aspirin  and Ibuprofen   History: Past Medical History:  Diagnosis Date   Colon cancer (HCC)    2013   Diabetes mellitus without complication (HCC)    Hypertension    Pulmonary embolism (HCC)    14 to 15 years ago   Past Surgical History:  Procedure Laterality Date   CAROTID PTA/STENT INTERVENTION Right 03/30/2022   Procedure: CAROTID PTA/STENT INTERVENTION;  Surgeon: Marea Selinda RAMAN, MD;  Location: [facility] INVASIVE CV LAB;  Service: Cardiovascular;  Laterality: Right;   CHOLECYSTECTOMY     FOOT SURGERY Right    HEMORRHOID SURGERY     KNEE ARTHROSCOPY Right    LITHOTRIPSY     PARATHYROIDECTOMY     TOE AMPUTATION Right    3 toes   TONSILLECTOMY     Family History  Problem Relation Age of Onset   Arthritis/Rheumatoid Mother    Heart failure Mother    Dementia Father    Social History   Socioeconomic History   Marital status: Married    Spouse name: Not on file   Number of children: Not on file   Years of education: Not on file   Highest education level: Not on file  Occupational History   Not on file  Tobacco Use   Smoking status: Former    Current packs/day: 0.00    Average packs/day: 1 pack/day for 10.0 years (10.0 ttl pk-yrs)    Types: Cigarettes,  Cigars    Start date: 02/27/1963    Quit date: 05/25/2021    Years since quitting: 2.4   Smokeless tobacco: Current    Types: Chew  Vaping Use   Vaping status: Never Used  Substance and Sexual Activity   Alcohol use: Not Currently    Comment: RARE   Drug use: Never   Sexual activity: Not Currently  Other Topics Concern   Not on file  Social History Narrative   Not on file   Social Drivers of Health   Financial Resource Strain: Low Risk  (10/19/2023)   Overall Financial Resource Strain (CARDIA)    Difficulty of Paying Living Expenses: Not hard at all  Food Insecurity: No Food Insecurity (10/19/2023)   Hunger Vital Sign    Worried About Running Out of Food in the Last Year: Never true  Ran Out of Food in the Last Year: Never true  Transportation Needs: No Transportation Needs (10/19/2023)   PRAPARE - Administrator, Civil Service (Medical): No    Lack of Transportation (Non-Medical): No  Physical Activity: Sufficiently Active (10/19/2023)   Exercise Vital Sign    Days of Exercise per Week: 5 days    Minutes of Exercise per Session: 30 min  Stress: No Stress Concern Present (10/19/2023)   [employer] of Occupational Health - Occupational Stress Questionnaire    Feeling of Stress: Not at all  Social Connections: Moderately Isolated (10/19/2023)   Social Connection and Isolation Panel    Frequency of Communication with Friends and Family: More than three times a week    Frequency of Social Gatherings with Friends and Family: More than three times a week    Attends Religious Services: Never    Database administrator or Organizations: No    Attends Engineer, structural: Never    Marital Status: Married    Tobacco Counseling Ready to quit: Not Answered Counseling given: Not Answered    Clinical Intake:  Pre-visit preparation completed: Yes  Pain : No/denies pain     BMI - recorded: 29.16 Nutritional Status: BMI 25 -29  Overweight Nutritional Risks: None Diabetes: Yes CBG done?: No Did pt. bring in CBG monitor from home?: No  Lab Results  Component Value Date   HGBA1C 6.8 (A) 08/29/2023   HGBA1C 7.5 (H) 03/30/2022   HGBA1C 7.2 (H) 08/26/2021     How often do you need to have someone help you when you read instructions, pamphlets, or other written materials from your doctor or pharmacy?: 1 - Never  Interpreter Needed?: No  Information entered by :: Birdie Fetty,CMA   Activities of Daily Living     10/19/2023   11:47 AM 08/29/2023   10:41 AM  In your present state of health, do you have any difficulty performing the following activities:  Hearing? 0 0  Vision? 0 0  Difficulty concentrating or making decisions? 1 0  Walking or climbing stairs? 1 1  Dressing or bathing? 0 0  Doing errands, shopping? 1 1  Preparing Food and eating ? N   Using the Toilet? N   In the past six months, have you accidently leaked urine? N   Do you have problems with loss of bowel control? N   Managing your Medications? N   Managing your Finances? N   Housekeeping or managing your Housekeeping? N     Patient Care Team: Bernardo Fend, DO as PCP - General (Internal Medicine)  I have updated your Care Teams any recent Medical Services you may have received from other providers in the past year.     Assessment:   This is a routine wellness examination for Daniel Owen.  Hearing/Vision screen Hearing Screening - Comments:: No difficulties  Vision Screening - Comments:: Patient has readers    Goals Addressed             This Visit's Progress    Patient Stated       To be alive        Depression Screen     10/19/2023   11:48 AM 08/29/2023   10:41 AM  PHQ 2/9 Scores  PHQ - 2 Score 0 0  PHQ- 9 Score 0     Fall Risk     10/19/2023   11:47 AM 08/29/2023   10:41 AM  Fall Risk   Falls  in the past year? 0 0  Number falls in past yr: 0 0  Injury with Fall? 0 0  Risk for fall due to : No  Fall Risks No Fall Risks  Follow up Falls evaluation completed Falls evaluation completed    MEDICARE RISK AT HOME:  Medicare Risk at Home Any stairs in or around the home?: Yes If so, are there any without handrails?: No Home free of loose throw rugs in walkways, pet beds, electrical cords, etc?: Yes Adequate lighting in your home to reduce risk of falls?: Yes Life alert?: No Use of a cane, walker or w/c?: Yes Grab bars in the bathroom?: Yes Shower chair or bench in shower?: Yes Elevated toilet seat or a handicapped toilet?: Yes  TIMED UP AND GO:  Was the test performed?  No  Cognitive Function: 6CIT completed        10/19/2023   11:46 AM  6CIT Screen  What Year? 0 points  What month? 0 points  What time? 0 points  Count back from 20 0 points  Months in reverse 0 points  Repeat phrase 0 points  Total Score 0 points    Immunizations Immunization History  Administered Date(s) Administered   Influenza Whole 09/29/2013   Influenza, Seasonal, Injecte, Preservative Fre 01/18/2012   Influenza-Unspecified 10/23/2016, 09/29/2017, 10/06/2019   PNEUMOCOCCAL CONJUGATE-20 08/29/2023   Pneumococcal Polysaccharide-23 04/10/2007    Screening Tests Health Maintenance  Topic Date Due   COVID-19 Vaccine (1) Never done   FOOT EXAM  Never done   DTaP/Tdap/Td (1 - Tdap) Never done   Zoster Vaccines- Shingrix (1 of 2) Never done   OPHTHALMOLOGY EXAM  12/24/2013   Influenza Vaccine  08/10/2023   HEMOGLOBIN A1C  02/29/2024   Diabetic kidney evaluation - eGFR measurement  06/07/2024   Diabetic kidney evaluation - Urine ACR  08/28/2024   Medicare Annual Wellness (AWV)  10/18/2024   Pneumococcal Vaccine: 50+ Years  Completed   Meningococcal B Vaccine  Aged Out    Health Maintenance Items Addressed:patient   Additional Screening:  Vision Screening: Recommended annual ophthalmology exams for early detection of glaucoma and other disorders of the eye. Is the patient up to date  with their annual eye exam?  No  Who is the provider or what is the name of the office in which the patient attends annual eye exams?   Dental Screening: Recommended annual dental exams for proper oral hygiene  Community Resource Referral / Chronic Care Management: CRR required this visit?  No   CCM required this visit?  No   Plan:    I have personally reviewed and noted the following in the patient's chart:   Medical and social history Use of alcohol, tobacco or illicit drugs  Current medications and supplements including opioid prescriptions. Patient is not currently taking opioid prescriptions. Functional ability and status Nutritional status Physical activity Advanced directives List of other physicians Hospitalizations, surgeries, and ER visits in previous 12 months Vitals Screenings to include cognitive, depression, and falls Referrals and appointments  In addition, I have reviewed and discussed with patient certain preventive protocols, quality metrics, and best practice recommendations. A written personalized care plan for preventive services as well as general preventive health recommendations were provided to patient.   Daniel Owen Right, [place]   10/19/2023   After Visit Summary: (MyChart) Due to this being a telephonic visit, the after visit summary with patients personalized plan was offered to patient via MyChart   Notes: Nothing significant to  report at this time.

## 2023-10-24 DIAGNOSIS — L820 Inflamed seborrheic keratosis: Secondary | ICD-10-CM | POA: Diagnosis not present

## 2023-10-24 DIAGNOSIS — D2272 Melanocytic nevi of left lower limb, including hip: Secondary | ICD-10-CM | POA: Diagnosis not present

## 2023-10-24 DIAGNOSIS — D2271 Melanocytic nevi of right lower limb, including hip: Secondary | ICD-10-CM | POA: Diagnosis not present

## 2023-10-24 DIAGNOSIS — L538 Other specified erythematous conditions: Secondary | ICD-10-CM | POA: Diagnosis not present

## 2023-10-24 DIAGNOSIS — D2261 Melanocytic nevi of right upper limb, including shoulder: Secondary | ICD-10-CM | POA: Diagnosis not present

## 2023-10-24 DIAGNOSIS — L570 Actinic keratosis: Secondary | ICD-10-CM | POA: Diagnosis not present

## 2023-10-24 DIAGNOSIS — D225 Melanocytic nevi of trunk: Secondary | ICD-10-CM | POA: Diagnosis not present

## 2023-10-24 DIAGNOSIS — Z85828 Personal history of other malignant neoplasm of skin: Secondary | ICD-10-CM | POA: Diagnosis not present

## 2023-10-24 DIAGNOSIS — L821 Other seborrheic keratosis: Secondary | ICD-10-CM | POA: Diagnosis not present

## 2023-10-24 DIAGNOSIS — D485 Neoplasm of uncertain behavior of skin: Secondary | ICD-10-CM | POA: Diagnosis not present

## 2023-10-24 DIAGNOSIS — D2262 Melanocytic nevi of left upper limb, including shoulder: Secondary | ICD-10-CM | POA: Diagnosis not present

## 2023-10-24 DIAGNOSIS — Z08 Encounter for follow-up examination after completed treatment for malignant neoplasm: Secondary | ICD-10-CM | POA: Diagnosis not present

## 2023-11-06 DIAGNOSIS — R82991 Hypocitraturia: Secondary | ICD-10-CM | POA: Diagnosis not present

## 2023-11-06 DIAGNOSIS — R34 Anuria and oliguria: Secondary | ICD-10-CM | POA: Diagnosis not present

## 2023-11-06 DIAGNOSIS — R82992 Hyperoxaluria: Secondary | ICD-10-CM | POA: Diagnosis not present

## 2023-11-06 DIAGNOSIS — C20 Malignant neoplasm of rectum: Secondary | ICD-10-CM | POA: Diagnosis not present

## 2023-11-06 DIAGNOSIS — N200 Calculus of kidney: Secondary | ICD-10-CM | POA: Diagnosis not present

## 2023-11-06 DIAGNOSIS — M1030 Gout due to renal impairment, unspecified site: Secondary | ICD-10-CM | POA: Diagnosis not present

## 2023-11-06 DIAGNOSIS — N289 Disorder of kidney and ureter, unspecified: Secondary | ICD-10-CM | POA: Diagnosis not present

## 2023-11-06 DIAGNOSIS — R82994 Hypercalciuria: Secondary | ICD-10-CM | POA: Diagnosis not present

## 2023-11-06 DIAGNOSIS — R82993 Hyperuricosuria: Secondary | ICD-10-CM | POA: Diagnosis not present

## 2023-11-06 DIAGNOSIS — N138 Other obstructive and reflux uropathy: Secondary | ICD-10-CM | POA: Diagnosis not present

## 2023-11-13 ENCOUNTER — Ambulatory Visit (INDEPENDENT_AMBULATORY_CARE_PROVIDER_SITE_OTHER)

## 2023-11-13 DIAGNOSIS — Z23 Encounter for immunization: Secondary | ICD-10-CM

## 2023-11-22 ENCOUNTER — Telehealth: Payer: Self-pay

## 2023-11-22 NOTE — Telephone Encounter (Signed)
 Copied from CRM [id number]. Topic: Clinical - Prescription Issue >> Nov 22, 2023  4:21 PM Shanda MATSU wrote: Reason for CRM: Patient's daughter, Phillp Dolores [facility] [phone], called in to check the status of PA for [place] 3 sensors, adv patient that insurance will only cover the glucometer where patient sticks their finger. Caller is req a call back from provider and or nurse to see if there is any way to have the sensors covered as the patient is not able to continuously stick himself. >> Nov 22, 2023  4:53 PM Tobias L wrote: Patient's daughter states she called the insurance and was informed the PA was resubmitted about 20 min ago. Daughter would like to confirm PA was re submitted.   Called [employer] spoke to Claude. Melissa confirmed fax was received from covermymeds about re submittal for PA for patient's sensors. Fax will be forwarded to PA team to take care of.  Advised daughter. Daughter verbalized understanding.

## 2023-11-23 ENCOUNTER — Other Ambulatory Visit (HOSPITAL_COMMUNITY): Payer: Self-pay

## 2023-12-10 ENCOUNTER — Ambulatory Visit: Admitting: Internal Medicine

## 2023-12-10 ENCOUNTER — Other Ambulatory Visit: Payer: Self-pay

## 2023-12-10 ENCOUNTER — Encounter: Payer: Self-pay | Admitting: Internal Medicine

## 2023-12-10 VITALS — BP 128/74 | HR 74 | Resp 16 | Ht 72.0 in | Wt 204.6 lb

## 2023-12-10 DIAGNOSIS — E1159 Type 2 diabetes mellitus with other circulatory complications: Secondary | ICD-10-CM

## 2023-12-10 DIAGNOSIS — D682 Hereditary deficiency of other clotting factors: Secondary | ICD-10-CM

## 2023-12-10 DIAGNOSIS — I10 Essential (primary) hypertension: Secondary | ICD-10-CM

## 2023-12-10 DIAGNOSIS — Z7901 Long term (current) use of anticoagulants: Secondary | ICD-10-CM

## 2023-12-10 LAB — POCT GLYCOSYLATED HEMOGLOBIN (HGB A1C): Hemoglobin A1C: 6.8 % — AB (ref 4.0–5.6)

## 2023-12-10 LAB — POCT INR
INR: 3.3 — AB (ref 2.0–3.0)
POC INR: 39.4

## 2023-12-10 LAB — GLUCOSE, POCT (MANUAL RESULT ENTRY): POC Glucose: 96 mg/dL (ref 70–99)

## 2023-12-10 MED ORDER — FREESTYLE LIBRE 3 SENSOR MISC: 1.0000 | 6 refills | Status: AC

## 2023-12-10 NOTE — Progress Notes (Signed)
 Established Patient Office Visit  Subjective    Patient ID: Daniel Owen, male    DOB: September 05, 1940  Age: 83 y.o. MRN: [id number]  CC:  Chief Complaint  Patient presents with   Anticoagulation   Diabetes    HPI Daniel Owen presents to recheck INR today.  Discussed the use of AI scribe software for clinical note transcription with the patient, who gave verbal consent to proceed.  History of Present Illness Daniel Owen is an 83 year old male with diabetes and atrial fibrillation who presents for a follow-up visit to manage his warfarin therapy and discuss glucose monitoring issues.  He is on warfarin for atrial fibrillation with INR 3.3 today. He takes 7.5 mg on Sundays and Wednesdays and 3.75 mg on other days and has not yet taken today's dose, which he usually takes at night.  He has diabetes with recent A1c 6.8. His continuous glucose monitor is not covered by insurance and he cannot operate it. His daughter checks finger-stick glucose 2 to 3 times per week. Recent readings were around 200 mg/dL, later decreasing to 829 mg/dL.  He recently recovered from a cold or sinus infection. He still has alternating nasal congestion and runny nose without fever.  He developed new right foot pain today. The pain is throbbing on the top of the foot. He has difficulty moving his toes and has areas of decreased sensation. He has prior bone shifts and surgery on this foot.  His current medications include warfarin and Lyrica .   Hypertension: -Medications: Losartan  25 mg (alternates 25 and 12.5 mg every other day), KCl 20 mEq BID -Last K checked 2/25 4.6 -Patient is compliant with above medications and reports no side effects. -Denies any SOB, CP, vision changes, LE edema or symptoms of hypotension  HLD: -Hx of right carotid stent 4/24 -Following with Dr. Marea -Medications: Lipitor 40 mg -Patient is compliant with above medications and reports no side effects.  -Last lipid panel:  2/25 TC 107, LDL 51, HDL 31, triglycerides 144  Diabetes, Type 2: -Last A1c 8/25 6.8% -Medications: Metformin  500 mg BID, Jardiance  10 mg -Patient is compliant with the above medications but does endorse some diarrhea with the Metformin  -Eye exam: UTD -Foot exam: Due -Microalbumin: UTD -Statin: yes -PNA vaccine: UTD -Denies symptoms of hypoglycemia, polyuria, polydipsia, numbness extremities, foot ulcers/trauma.   Hx of DVT/PE/Factor 5 Leiden: -Currently on Warfarin 7.5 mg 2 days a week and 3.75 mg 5 days a week -INR 3.3 today -Last blood clot multiple years ago, no issues since being on the Warfarin  HX of Rectal Cancer: -First diagnosed in January 2013, large colon resection and chemotherapy  -Last colonoscopy [facility] 03/2015, repeat in 5 years   Hx of Gout: -Currently on Allopurinol  300 mg daily  -No recent gout flares since being on the medication  BPH: -Currently on Flomax  0.4 mg daily, symptoms controlled   Neuropathy: -Currently Lyrica  75 mg BID  Health Maintenance: -Blood work UTD  -Colon cancer screening due, referral placed  Outpatient Encounter Medications as of 12/10/2023  Medication Sig   allopurinol  (ZYLOPRIM ) 300 MG tablet Take 1 tablet (300 mg total) by mouth daily.   atorvastatin  (LIPITOR) 40 MG tablet Take 1 tablet (40 mg total) by mouth daily.   calcium  citrate (CALCITRATE - DOSED IN MG ELEMENTAL CALCIUM ) 950 (200 Ca) MG tablet Take 2 tablets by mouth 2 (two) times daily.   empagliflozin  (JARDIANCE ) 10 MG TABS tablet Take 1 tablet (10 mg total)  by mouth daily.   losartan  (COZAAR ) 25 MG tablet Take 25 mg alternated with 12.5 mg (1/2 tablet) every other day.   meclizine  (ANTIVERT ) 25 MG tablet Take 25 mg by mouth 3 (three) times daily as needed for dizziness.   metFORMIN  (GLUCOPHAGE -XR) 500 MG 24 hr tablet Take 1 tablet (500 mg total) by mouth 2 (two) times daily with a meal.   potassium citrate  (UROCIT-K ) 10 MEQ (1080 MG) SR tablet Take 2 tablets (20 mEq  total) by mouth 2 (two) times daily.   pregabalin  (LYRICA ) 75 MG capsule Take 1 capsule (75 mg total) by mouth 2 (two) times daily.   tamsulosin  (FLOMAX ) 0.4 MG CAPS capsule Take 1 capsule (0.4 mg total) by mouth daily.   warfarin (COUMADIN ) 7.5 MG tablet Take 7.5 mg 2 days a week and 3.75 (half tablet) all other days.   Blood Glucose Monitoring Suppl (ACCU-CHEK GUIDE ME) w/Device KIT 1 Units by Does not apply route daily.   Continuous Glucose Receiver (FREESTYLE LIBRE 3 READER) DEVI USE AS DIRECTED.   Continuous Glucose Sensor (FREESTYLE LIBRE 3 SENSOR) MISC 1 each by Does not apply route every 14 (fourteen) days. Place 1 sensor on the skin every 14 days. Use to check glucose continuously   glucose blood (ACCU-CHEK GUIDE TEST) test strip Use as instructed   No facility-administered encounter medications on file as of 12/10/2023.    Past Medical History:  Diagnosis Date   Colon cancer (HCC)    2013   Diabetes mellitus without complication (HCC)    Hypertension    Pulmonary embolism (HCC)    14 to 15 years ago    Past Surgical History:  Procedure Laterality Date   CAROTID PTA/STENT INTERVENTION Right 03/30/2022   Procedure: CAROTID PTA/STENT INTERVENTION;  Surgeon: Marea Selinda RAMAN, MD;  Location: [facility] INVASIVE CV LAB;  Service: Cardiovascular;  Laterality: Right;   CHOLECYSTECTOMY     FOOT SURGERY Right    HEMORRHOID SURGERY     KNEE ARTHROSCOPY Right    LITHOTRIPSY     PARATHYROIDECTOMY     TOE AMPUTATION Right    3 toes   TONSILLECTOMY      Family History  Problem Relation Age of Onset   Arthritis/Rheumatoid Mother    Heart failure Mother    Dementia Father     Social History   Socioeconomic History   Marital status: Married    Spouse name: Not on file   Number of children: Not on file   Years of education: Not on file   Highest education level: Not on file  Occupational History   Not on file  Tobacco Use   Smoking status: Former    Current packs/day: 0.00     Average packs/day: 1 pack/day for 10.0 years (10.0 ttl pk-yrs)    Types: Cigarettes, Cigars    Start date: 02/27/1963    Quit date: 05/25/2021    Years since quitting: 2.5   Smokeless tobacco: Current    Types: Chew  Vaping Use   Vaping status: Never Used  Substance and Sexual Activity   Alcohol use: Not Currently    Comment: RARE   Drug use: Never   Sexual activity: Not Currently  Other Topics Concern   Not on file  Social History Narrative   Not on file   Social Drivers of Health   Financial Resource Strain: Low Risk  (10/19/2023)   Overall Financial Resource Strain (CARDIA)    Difficulty of Paying Living Expenses: Not hard at  all  Food Insecurity: No Food Insecurity (10/19/2023)   Hunger Vital Sign    Worried About Running Out of Food in the Last Year: Never true    Ran Out of Food in the Last Year: Never true  Transportation Needs: No Transportation Needs (10/19/2023)   PRAPARE - Administrator, Civil Service (Medical): No    Lack of Transportation (Non-Medical): No  Physical Activity: Sufficiently Active (10/19/2023)   Exercise Vital Sign    Days of Exercise per Week: 5 days    Minutes of Exercise per Session: 30 min  Stress: No Stress Concern Present (10/19/2023)   [employer] of Occupational Health - Occupational Stress Questionnaire    Feeling of Stress: Not at all  Social Connections: Moderately Isolated (10/19/2023)   Social Connection and Isolation Panel    Frequency of Communication with Friends and Family: More than three times a week    Frequency of Social Gatherings with Friends and Family: More than three times a week    Attends Religious Services: Never    Database Administrator or Organizations: No    Attends Banker Meetings: Never    Marital Status: Married  Catering Manager Violence: Not At Risk (10/19/2023)   Humiliation, Afraid, Rape, and Kick questionnaire    Fear of Current or Ex-Partner: No    Emotionally Abused:  No    Physically Abused: No    Sexually Abused: No    Review of Systems  All other systems reviewed and are negative.       Objective    BP 128/74 (Cuff Size: Large)   Pulse 74   Resp 16   Ht 6' (1.829 m)   Wt 204 lb 9.6 oz (92.8 kg)   SpO2 98%   BMI 27.75 kg/m   Physical Exam Constitutional:      Appearance: Normal appearance.  HENT:     Head: Normocephalic and atraumatic.  Eyes:     Conjunctiva/sclera: Conjunctivae normal.  Cardiovascular:     Rate and Rhythm: Normal rate and regular rhythm.     Pulses:          Dorsalis pedis pulses are 2+ on the right side and 2+ on the left side.  Pulmonary:     Effort: Pulmonary effort is normal.     Breath sounds: Normal breath sounds.  Musculoskeletal:     Right foot: Normal range of motion. No deformity, bunion, Charcot foot, foot drop or prominent metatarsal heads.     Left foot: Normal range of motion. Deformity present. No bunion, Charcot foot, foot drop or prominent metatarsal heads.  Feet:     Right foot:     Protective Sensation: 8 sites tested.  2 sites sensed.     Skin integrity: Dry skin present.     Toenail Condition: Right toenails are normal.     Left foot:     Protective Sensation: 8 sites tested.  0 sites sensed.     Skin integrity: Dry skin present.     Toenail Condition: Left toenails are normal.     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  Neurological:     General: No focal deficit present.     Mental Status: He is alert. Mental status is at baseline.  Psychiatric:        Mood and Affect: Mood normal.        Behavior: Behavior normal.  Assessment & Plan:   Assessment & Plan  Type 2 diabetes mellitus with circulatory complications Diabetes well-controlled with A1c of 6.8. Difficulty with finger stick glucose monitoring due to physical limitations. Lapse in continuous glucose monitoring due to insurance issues. No diabetic foot  complications, decreased sensation in feet. Right foot pain likely from bone spur or previous bone shift, not diabetes-related. - Resubmitted prescription for continuous glucose monitor for prior authorization. - Provided Freestyle Libre 3 sample for glucose monitoring. - Continue current diabetes medications. - Encouraged use of Gold Bond lotion for foot care. - Follow up with foot doctor in [facility] as scheduled.  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   Hypertension Blood pressure well-controlled at 128/74. - Continue current blood pressure management regimen.   - POCT HgB A1C - POCT Glucose (CBG) - Continuous Glucose Sensor (FREESTYLE LIBRE 3 SENSOR) MISC; 1 each by Does not apply route every 14 (fourteen) days. Place 1 sensor on the skin every 14 days. Use to check glucose continuously  Dispense: 2 each; Refill: 6 - HM Diabetes Foot Exam - POCT INR  Return in about 2 weeks (around 12/24/2023) for INR.   Sharyle Fischer, DO

## 2023-12-11 ENCOUNTER — Other Ambulatory Visit (HOSPITAL_COMMUNITY): Payer: Self-pay

## 2023-12-22 ENCOUNTER — Other Ambulatory Visit: Payer: Self-pay | Admitting: Internal Medicine

## 2023-12-22 DIAGNOSIS — E782 Mixed hyperlipidemia: Secondary | ICD-10-CM

## 2023-12-22 DIAGNOSIS — Z95828 Presence of other vascular implants and grafts: Secondary | ICD-10-CM

## 2023-12-24 ENCOUNTER — Other Ambulatory Visit: Payer: Self-pay

## 2023-12-24 ENCOUNTER — Ambulatory Visit: Admitting: Internal Medicine

## 2023-12-24 VITALS — BP 118/74 | HR 92 | Temp 97.4°F | Resp 16 | Ht 72.0 in | Wt 204.0 lb

## 2023-12-24 DIAGNOSIS — R413 Other amnesia: Secondary | ICD-10-CM

## 2023-12-24 DIAGNOSIS — Z7901 Long term (current) use of anticoagulants: Secondary | ICD-10-CM

## 2023-12-24 LAB — POCT INR
INR: 2.3 (ref 2.0–3.0)
POC INR: 27.9

## 2023-12-24 MED ORDER — DONEPEZIL HCL 5 MG PO TBDP: 5.0000 mg | ORAL_TABLET | Freq: Every day | ORAL | 0 refills | Status: AC

## 2023-12-24 NOTE — Progress Notes (Signed)
 Established Patient Office Visit  Subjective    Patient ID: Daniel Owen, male    DOB: [birth year]  Age: 83 y.o. MRN: [id number]  CC:  Chief Complaint  Patient presents with   Anticoagulation    2 week recheck    HPI Daniel Owen presents to recheck INR today.  Discussed the use of AI scribe software for clinical note transcription with the patient, who gave verbal consent to proceed.  History of Present Illness  Daniel Owen is an 83 year old male who presents for an INR check.  His INR was 3.3 two weeks ago and is now 2.3.  He has new concerns about worsening memory and has not had a prior cognitive evaluation or diagnosis. He is interested in options to help with memory.  His blood pressure is well controlled and his last A1c was 6.8.   Hypertension: -Medications: Losartan  25 mg (alternates 25 and 12.5 mg every other day), KCl 20 mEq BID -Last K checked 2/25 4.6 -Patient is compliant with above medications and reports no side effects. -Denies any SOB, CP, vision changes, LE edema or symptoms of hypotension  HLD: -Hx of right carotid stent 4/24 -Following with Dr. Marea -Medications: Lipitor 40 mg -Patient is compliant with above medications and reports no side effects.  -Last lipid panel: 2/25 TC 107, LDL 51, HDL 31, triglycerides 144  Diabetes, Type 2: -Last A1c 12/25 6.8% -Medications: Metformin  500 mg BID, Jardiance  10 mg -Patient is compliant with the above medications but does endorse some diarrhea with the Metformin  -Eye exam: UTD -Foot exam: UTD -Microalbumin: UTD -Statin: yes -PNA vaccine: UTD -Denies symptoms of hypoglycemia, polyuria, polydipsia, numbness extremities, foot ulcers/trauma.   Hx of DVT/PE/Factor 5 Leiden: -Currently on Warfarin 7.5 mg 2 days a week and 3.75 mg 5 days a week -INR 2.3 today -Last blood clot multiple years ago, no issues since being on the Warfarin  HX of Rectal Cancer: -First diagnosed in January 2013, large  colon resection and chemotherapy  -Last colonoscopy [facility] 03/2015, repeat in 5 years   Hx of Gout: -Currently on Allopurinol  300 mg daily  -No recent gout flares since being on the medication  BPH: -Currently on Flomax  0.4 mg daily, symptoms controlled   Neuropathy: -Currently Lyrica  75 mg BID  Health Maintenance: -Blood work UTD  -Colon cancer screening due, referral placed  Outpatient Encounter Medications as of 12/24/2023  Medication Sig   allopurinol  (ZYLOPRIM ) 300 MG tablet Take 1 tablet (300 mg total) by mouth daily.   atorvastatin  (LIPITOR) 40 MG tablet Take 1 tablet (40 mg total) by mouth daily.   calcium  citrate (CALCITRATE - DOSED IN MG ELEMENTAL CALCIUM ) 950 (200 Ca) MG tablet Take 2 tablets by mouth 2 (two) times daily.   empagliflozin  (JARDIANCE ) 10 MG TABS tablet Take 1 tablet (10 mg total) by mouth daily.   losartan  (COZAAR ) 25 MG tablet Take 25 mg alternated with 12.5 mg (1/2 tablet) every other day.   meclizine  (ANTIVERT ) 25 MG tablet Take 25 mg by mouth 3 (three) times daily as needed for dizziness.   metFORMIN  (GLUCOPHAGE -XR) 500 MG 24 hr tablet Take 1 tablet (500 mg total) by mouth 2 (two) times daily with a meal.   potassium citrate  (UROCIT-K ) 10 MEQ (1080 MG) SR tablet Take 2 tablets (20 mEq total) by mouth 2 (two) times daily.   pregabalin  (LYRICA ) 75 MG capsule Take 1 capsule (75 mg total) by mouth 2 (two) times daily.   tamsulosin  (  FLOMAX ) 0.4 MG CAPS capsule Take 1 capsule (0.4 mg total) by mouth daily.   warfarin (COUMADIN ) 7.5 MG tablet Take 7.5 mg 2 days a week and 3.75 (half tablet) all other days.   Blood Glucose Monitoring Suppl (ACCU-CHEK GUIDE ME) w/Device KIT 1 Units by Does not apply route daily.   Continuous Glucose Receiver (FREESTYLE LIBRE 3 READER) DEVI USE AS DIRECTED.   Continuous Glucose Sensor (FREESTYLE LIBRE 3 SENSOR) MISC 1 each by Does not apply route every 14 (fourteen) days. Place 1 sensor on the skin every 14 days. Use to check glucose  continuously   glucose blood (ACCU-CHEK GUIDE TEST) test strip Use as instructed   No facility-administered encounter medications on file as of 12/24/2023.    Past Medical History:  Diagnosis Date   Colon cancer (HCC)    2013   Diabetes mellitus without complication (HCC)    Hypertension    Pulmonary embolism (HCC)    14 to 15 years ago    Past Surgical History:  Procedure Laterality Date   CAROTID PTA/STENT INTERVENTION Right 03/30/2022   Procedure: CAROTID PTA/STENT INTERVENTION;  Surgeon: Marea Selinda RAMAN, MD;  Location: [facility] INVASIVE CV LAB;  Service: Cardiovascular;  Laterality: Right;   CHOLECYSTECTOMY     FOOT SURGERY Right    HEMORRHOID SURGERY     KNEE ARTHROSCOPY Right    LITHOTRIPSY     PARATHYROIDECTOMY     TOE AMPUTATION Right    3 toes   TONSILLECTOMY      Family History  Problem Relation Age of Onset   Arthritis/Rheumatoid Mother    Heart failure Mother    Dementia Father     Social History   Socioeconomic History   Marital status: Married    Spouse name: Not on file   Number of children: Not on file   Years of education: Not on file   Highest education level: Not on file  Occupational History   Not on file  Tobacco Use   Smoking status: Former    Current packs/day: 0.00    Average packs/day: 1 pack/day for 10.0 years (10.0 ttl pk-yrs)    Types: Cigarettes, Cigars    Start date: 02/27/1963    Quit date: 05/25/2021    Years since quitting: 2.5   Smokeless tobacco: Current    Types: Chew  Vaping Use   Vaping status: Never Used  Substance and Sexual Activity   Alcohol use: Not Currently    Comment: RARE   Drug use: Never   Sexual activity: Not Currently  Other Topics Concern   Not on file  Social History Narrative   Not on file   Social Drivers of Health   Tobacco Use: High Risk (12/10/2023)   Patient History    Smoking Tobacco Use: Former    Smokeless Tobacco Use: Current    Passive Exposure: Not on Actuary Strain: Low  Risk (10/19/2023)   Overall Financial Resource Strain (CARDIA)    Difficulty of Paying Living Expenses: Not hard at all  Food Insecurity: No Food Insecurity (10/19/2023)   Epic    Worried About Programme Researcher, Broadcasting/film/video in the Last Year: Never true    Ran Out of Food in the Last Year: Never true  Transportation Needs: No Transportation Needs (10/19/2023)   Epic    Lack of Transportation (Medical): No    Lack of Transportation (Non-Medical): No  Physical Activity: Sufficiently Active (10/19/2023)   Exercise Vital Sign    Days  of Exercise per Week: 5 days    Minutes of Exercise per Session: 30 min  Stress: No Stress Concern Present (10/19/2023)   [employer] of Occupational Health - Occupational Stress Questionnaire    Feeling of Stress: Not at all  Social Connections: Moderately Isolated (10/19/2023)   Social Connection and Isolation Panel    Frequency of Communication with Friends and Family: More than three times a week    Frequency of Social Gatherings with Friends and Family: More than three times a week    Attends Religious Services: Never    Database Administrator or Organizations: No    Attends Banker Meetings: Never    Marital Status: Married  Catering Manager Violence: Not At Risk (10/19/2023)   Epic    Fear of Current or Ex-Partner: No    Emotionally Abused: No    Physically Abused: No    Sexually Abused: No  Depression (PHQ2-9): Low Risk (10/19/2023)   Depression (PHQ2-9)    PHQ-2 Score: 0  Alcohol Screen: Low Risk (10/19/2023)   Alcohol Screen    Last Alcohol Screening Score (AUDIT): 0  Housing: Low Risk (10/19/2023)   Epic    Unable to Pay for Housing in the Last Year: No    Number of Times Moved in the Last Year: 0    Homeless in the Last Year: No  Utilities: Not At Risk (10/19/2023)   Epic    Threatened with loss of utilities: No  Health Literacy: Adequate Health Literacy (10/19/2023)   B1300 Health Literacy    Frequency of need for help  with medical instructions: Never    Review of Systems  Psychiatric/Behavioral:  Positive for memory loss.   All other systems reviewed and are negative.       Objective    BP 118/74 (Cuff Size: Large)   Pulse 92   Temp (!) 97.4 F (36.3 C) (Oral)   Resp 16   Ht 6' (1.829 m)   Wt 204 lb (92.5 kg)   SpO2 98%   BMI 27.67 kg/m   Physical Exam Constitutional:      Appearance: Normal appearance.  HENT:     Head: Normocephalic and atraumatic.  Eyes:     Conjunctiva/sclera: Conjunctivae normal.  Cardiovascular:     Rate and Rhythm: Normal rate and regular rhythm.  Pulmonary:     Effort: Pulmonary effort is normal.     Breath sounds: Normal breath sounds.  Skin:    General: Skin is warm and dry.  Neurological:     General: No focal deficit present.     Mental Status: He is alert. Mental status is at baseline.  Psychiatric:        Mood and Affect: Mood normal.        Behavior: Behavior normal.       12/24/2023   11:36 AM  MMSE - Mini Mental State Exam  Orientation to time 1  Orientation to Place 5  Registration 3  Attention/ Calculation 2  Recall 2  Language- name 2 objects 2  Language- repeat 1  Language- follow 3 step command 2  Language- read & follow direction 1  Write a sentence 1  Copy design 0  Total score 20         Assessment & Plan:   Assessment & Plan  Cognitive impairment Memory issues noted without formal diagnosis. Mini mental exam conducted to establish baseline, decreased at 20. - Prescribed Aricept  5 mg once daily for three  months. - Scheduled follow-up in three months to reassess cognitive function. - Consider neurology referral if no improvement or worsening.  Chronic anticoagulation therapy INR improved from 3.3 to 2.3, indicating effective management.  - POCT INR - donepezil  (ARICEPT  ODT) 5 MG disintegrating tablet; Take 1 tablet (5 mg total) by mouth at bedtime.  Dispense: 90 tablet; Refill: 0   Return in about 3 months  (around 03/23/2024).   Sharyle Fischer, DO

## 2023-12-25 NOTE — Telephone Encounter (Signed)
 Requested medication (s) are due for refill today -unknown  Requested medication (s) are on the active medication list -yes  Future visit scheduled -yes  Last refill: 10/17/23  Notes to clinic: Rx refill requested not on current medication list  Requested Prescriptions  Pending Prescriptions Disp Refills   Accu-Chek Softclix Lancets lancets [Pharmacy Med Name: ACCU-CHEK SOFTCLIX LANCETS] 100 each     Sig: USE AS DIRECTED.     Endocrinology: Diabetes - Testing Supplies Passed - 12/25/2023  1:44 PM      Passed - Valid encounter within last 12 months    Recent Outpatient Visits           Yesterday On continuous oral anticoagulation   [facility] Bern[facility] Fend, DO   2 weeks ago Type 2 diabetes mellitus with other circulatory complication, without long-term current use of insulin  [facility])   [place] [facility] Bernardo Fend, DO   2 months ago Benign paroxysmal positional vertigo, unspecified laterality   [facility], DO   3 months ago Type 2 diabetes mellitus with other circulatory complication, without long-term current use of insulin  [facility])   [place] [facility] Bernardo Fend, DO              Refused Prescriptions Disp Refills   atorvastatin  (LIPITOR) 40 MG tablet [Pharmacy Med Name: ATORVASTATIN  CALCIUM  40 MG TAB] 90 tablet 1    Sig: TAKE 1 TABLET BY MOUTH ONCE EVERY EVENING     Cardiovascular:  Antilipid - Statins Failed - 12/25/2023  1:44 PM      Failed - Lipid Panel in normal range within the last 12 months    Cholesterol  Date Value Ref Range Status  03/09/2023 107 0 - 200 Final   LDL Cholesterol  Date Value Ref Range Status  03/09/2023 51  Final   HDL  Date Value Ref Range Status  03/09/2023 31 (A) 35 - 70 Final   Triglycerides  Date Value Ref Range Status  03/09/2023 144 40 - 160 Final         Passed - Patient is not pregnant       Passed - Valid encounter within last 12 months    Recent Outpatient Visits           Yesterday On continuous oral anticoagulation   [place] [facility] Bernardo Fend, DO   2 weeks ago Type 2 diabetes mellitus with other circulatory complication, without long-term current use of insulin  [facility])   [facility] Fend, DO   2 months ago Benign paroxysmal positional vertigo, unspecified laterality   [facility] Bernardo Fend, DO   3 months ago Type 2 diabetes mellitus with other circulatory complication, without long-term current use of insulin  [facility])   [place] [facility] Bernardo Fend, DO                 Requested Prescriptions  Pending Prescriptions Disp Refills   Accu-Chek Softclix Lancets lancets [Pharmacy Med Name: ACCU-CHEK SOFTCLIX LANCETS] 100 each     Sig: USE AS DIRECTED.     Endocrinology: Diabetes - Testing Supplies Passed - 12/25/2023  1:44 PM      Passed - Valid encounter within last 12 months    Recent Outpatient Visits           Yesterday On continuous oral anticoagulation   [place] [facility] [place], [place], [place]  2 weeks ago Type 2 diabetes mellitus with other circulatory complication, without long-term current use of insulin  [facility])   [facility] Bernardo Fend, DO   2 months ago Benign paroxysmal positional vertigo, unspecified laterality   [facility]end, DO   3 months ago Type 2 diabetes mellitus with other circulatory complication, without long-term current use of insulin  [facility])   [place] [facility] Bernardo Fend, DO              Refused Prescriptions Disp Refills   atorvastatin  (LIPITOR) 40 MG tablet [Pharmacy Med Name: ATORVASTATIN  CALCIUM  40 MG TAB] 90 tablet 1    Sig: TAKE 1 TABLET BY MOUTH ONCE EVERY EVENING      Cardiovascular:  Antilipid - Statins Failed - 12/25/2023  1:44 PM      Failed - Lipid Panel in normal range within the last 12 months    Cholesterol  Date Value Ref Range Status  03/09/2023 107 0 - 200 Final   LDL Cholesterol  Date Value Ref Range Status  03/09/2023 51  Final   HDL  Date Value Ref Range Status  03/09/2023 31 (A) 35 - 70 Final   Triglycerides  Date Value Ref Range Status  03/09/2023 144 40 - 160 Final         Passed - Patient is not pregnant      Passed - Valid encounter within last 12 months    Recent Outpatient Visits           Yesterday On continuous oral anticoagulation   [place] [facility] Bernardo Fend, DO   2 weeks ago Type 2 diabetes mellitus with other circulatory complication, without long-term current use of insulin  [facility])   [facility] Fend, DO   2 months ago Benign paroxysmal positional vertigo, unspecified laterality   [place] [facility] Bernardo Fend, DO   3 months ago Type 2 diabetes mellitus with other circulatory complication, without long-term current use of insulin  [facility])   [facility] [address] [facility] Bernardo Fend, [place]

## 2023-12-25 NOTE — Telephone Encounter (Signed)
 Rx 08/29/23 #90 1RF- too soon Requested Prescriptions  Pending Prescriptions Disp Refills   Accu-Chek Softclix Lancets lancets [Pharmacy Med Name: ACCU-CHEK SOFTCLIX LANCETS] 100 each     Sig: USE AS DIRECTED.     Endocrinology: Diabetes - Testing Supplies Passed - 12/25/2023  1:43 PM      Passed - Valid encounter within last 12 months    Recent Outpatient Visits           Yesterday On continuous oral anticoagulation   [place] [facility] Bernardo Fend, DO   2 weeks ago Type 2 diabetes mellitus with other circulatory complication, without long-term current use of insulin  [facility])   [facility] [facility] Fend, DO   2 months ago Benign paroxysmal positional vertigo, unspecified laterality   [facility] Bern[facility] Fend, DO   3 months ago Type 2 diabetes mellitus with other circulatory complication, without long-term current use of insulin  [facility])   [place] [facility] Bernardo Fend, DO              Refused Prescriptions Disp Refills   atorvastatin  (LIPITOR) 40 MG tablet [Pharmacy Med Name: ATORVASTATIN  CALCIUM  40 MG TAB] 90 tablet 1    Sig: TAKE 1 TABLET BY MOUTH ONCE EVERY EVENING     Cardiovascular:  Antilipid - Statins Failed - 12/25/2023  1:43 PM      Failed - Lipid Panel in normal range within the last 12 months    Cholesterol  Date Value Ref Range Status  03/09/2023 107 0 - 200 Final   LDL Cholesterol  Date Value Ref Range Status  03/09/2023 51  Final   HDL  Date Value Ref Range Status  03/09/2023 31 (A) 35 - 70 Final   Triglycerides  Date Value Ref Range Status  03/09/2023 144 40 - 160 Final         Passed - Patient is not pregnant      Passed - Valid encounter within last 12 months    Recent Outpatient Visits           Yesterday On continuous oral anticoagulation   [place] [facility] Bernardo Fend, DO   2 weeks ago Type 2  diabetes mellitus with other circulatory complication, without long-term current use of insulin  [facility])   [facility] Fend, DO   2 months ago Benign paroxysmal positional vertigo, unspecified laterality   [facility] Bernardo Fend, DO   3 months ago Type 2 diabetes mellitus with other circulatory complication, without long-term current use of insulin  [facility])   [facility] Bernardo Fend, [place]

## 2024-03-24 ENCOUNTER — Ambulatory Visit: Admitting: Internal Medicine

## 2024-05-06 ENCOUNTER — Ambulatory Visit (INDEPENDENT_AMBULATORY_CARE_PROVIDER_SITE_OTHER): Admitting: Vascular Surgery

## 2024-05-06 ENCOUNTER — Encounter (INDEPENDENT_AMBULATORY_CARE_PROVIDER_SITE_OTHER)
